# Patient Record
Sex: Female | Born: 1970 | Race: White | Hispanic: No | Marital: Married | Smoking: Never smoker
Health system: Southern US, Community
[De-identification: ages and names within clinical notes are randomized; demographics above are authoritative.]

## PROBLEM LIST (undated history)

## (undated) DIAGNOSIS — F419 Anxiety disorder, unspecified: Secondary | ICD-10-CM

## (undated) DIAGNOSIS — I82409 Acute embolism and thrombosis of unspecified deep veins of unspecified lower extremity: Secondary | ICD-10-CM

## (undated) DIAGNOSIS — J302 Other seasonal allergic rhinitis: Secondary | ICD-10-CM

## (undated) DIAGNOSIS — T7840XA Allergy, unspecified, initial encounter: Secondary | ICD-10-CM

## (undated) DIAGNOSIS — K219 Gastro-esophageal reflux disease without esophagitis: Secondary | ICD-10-CM

## (undated) DIAGNOSIS — K2970 Gastritis, unspecified, without bleeding: Secondary | ICD-10-CM

## (undated) HISTORY — DX: Allergy, unspecified, initial encounter: T78.40XA

## (undated) HISTORY — PX: WISDOM TOOTH EXTRACTION: SHX21

## (undated) HISTORY — DX: Gastritis, unspecified, without bleeding: K29.70

## (undated) HISTORY — DX: Anxiety disorder, unspecified: F41.9

## (undated) HISTORY — DX: Gastro-esophageal reflux disease without esophagitis: K21.9

## (undated) HISTORY — DX: Other seasonal allergic rhinitis: J30.2

## (undated) HISTORY — DX: Acute embolism and thrombosis of unspecified deep veins of unspecified lower extremity: I82.409

---

## 2005-07-25 ENCOUNTER — Emergency Department: Payer: Self-pay | Admitting: Emergency Medicine

## 2005-08-05 ENCOUNTER — Emergency Department: Payer: Self-pay | Admitting: Internal Medicine

## 2005-08-08 ENCOUNTER — Emergency Department: Payer: Self-pay | Admitting: Emergency Medicine

## 2006-05-18 ENCOUNTER — Ambulatory Visit: Payer: Self-pay

## 2010-07-12 HISTORY — PX: KNEE ARTHROSCOPY W/ ACL RECONSTRUCTION: SHX1858

## 2010-08-26 ENCOUNTER — Ambulatory Visit: Payer: Self-pay | Admitting: Orthopedic Surgery

## 2010-09-10 ENCOUNTER — Ambulatory Visit: Payer: Self-pay | Admitting: Orthopedic Surgery

## 2014-10-14 ENCOUNTER — Encounter: Payer: Self-pay | Admitting: Obstetrics & Gynecology

## 2014-10-14 ENCOUNTER — Ambulatory Visit (INDEPENDENT_AMBULATORY_CARE_PROVIDER_SITE_OTHER): Payer: 59 | Admitting: Obstetrics & Gynecology

## 2014-10-14 VITALS — BP 117/79 | HR 88 | Ht 66.0 in | Wt 192.0 lb

## 2014-10-14 DIAGNOSIS — L723 Sebaceous cyst: Secondary | ICD-10-CM

## 2014-10-14 DIAGNOSIS — Z01419 Encounter for gynecological examination (general) (routine) without abnormal findings: Secondary | ICD-10-CM

## 2014-10-14 DIAGNOSIS — N841 Polyp of cervix uteri: Secondary | ICD-10-CM

## 2014-10-14 DIAGNOSIS — Z124 Encounter for screening for malignant neoplasm of cervix: Secondary | ICD-10-CM

## 2014-10-14 DIAGNOSIS — Z803 Family history of malignant neoplasm of breast: Secondary | ICD-10-CM | POA: Diagnosis not present

## 2014-10-14 NOTE — Patient Instructions (Signed)
Thank you for enrolling in [facility]. Please follow the instructions below to securely access your online medical record. MyChart allows you to send messages to your doctor, view your test results, manage appointments, and more.   How Do I Sign Up? 1. In your Internet browser, go to [employer] and enter [email or web address]. 2. Click on the Sign Up Now link in the Sign In box. You will see the New Member Sign Up page. 3. Enter your MyChart Access Code exactly as it appears below. You will not need to use this code after you've completed the sign-up process. If you do not sign up before the expiration date, you must request a new code.  MyChart Access Code: [email or web address] Expires: 12/13/2014  1:34 PM  4. Enter your Social Security Number ([id number]) and Date of Birth (mm/dd/yyyy) as indicated and click Submit. You will be taken to the next sign-up page. 5. Create a MyChart ID. This will be your MyChart login ID and cannot be changed, so think of one that is secure and easy to remember. 6. Create a MyChart password. You can change your password at any time. 7. Enter your Password Reset Question and Answer. This can be used at a later time if you forget your password.  8. Enter your e-mail address. You will receive e-mail notification when new information is available in [facility]. 9. Click Sign Up. You can now view your medical record.   Additional Information Remember, MyChart is NOT to be used for urgent needs. For medical emergencies, dial 911.   Preventive Care for Adults A healthy lifestyle and preventive care can promote health and wellness. Preventive health guidelines for women include the following key practices.  A routine yearly physical is a good way to check with your health care provider about your health and preventive screening. It is a chance to share any concerns and updates on your health and to receive a thorough exam.  Visit your dentist for a routine exam  and preventive care every 6 months. Brush your teeth twice a day and floss once a day. Good oral hygiene prevents tooth decay and gum disease.  The frequency of eye exams is based on your age, health, family medical history, use of contact lenses, and other factors. Follow your health care provider's recommendations for frequency of eye exams.  Eat a healthy diet. Foods like vegetables, fruits, whole grains, low-fat dairy products, and lean protein foods contain the nutrients you need without too many calories. Decrease your intake of foods high in solid fats, added sugars, and salt. Eat the right amount of calories for you.Get information about a proper diet from your health care provider, if necessary.  Regular physical exercise is one of the most important things you can do for your health. Most adults should get at least 150 minutes of moderate-intensity exercise (any activity that increases your heart rate and causes you to sweat) each week. In addition, most adults need muscle-strengthening exercises on 2 or more days a week.  Maintain a healthy weight. The body mass index (BMI) is a screening tool to identify possible weight problems. It provides an estimate of body fat based on height and weight. Your health care provider can find your BMI and can help you achieve or maintain a healthy weight.For adults 20 years and older:  A BMI below 18.5 is considered underweight.  A BMI of 18.5 to 24.9 is normal.  A BMI of 25 to 29.9 is considered overweight.  A BMI  of 30 and above is considered obese.  Maintain normal blood lipids and cholesterol levels by exercising and minimizing your intake of saturated fat. Eat a balanced diet with plenty of fruit and vegetables. Blood tests for lipids and cholesterol should begin at age 52 and be repeated every 5 years. If your lipid or cholesterol levels are high, you are over 50, or you are at high risk for heart disease, you may need your cholesterol levels  checked more frequently.Ongoing high lipid and cholesterol levels should be treated with medicines if diet and exercise are not working.  If you smoke, find out from your health care provider how to quit. If you do not use tobacco, do not start.  Lung cancer screening is recommended for adults aged 68-80 years who are at high risk for developing lung cancer because of a history of smoking. A yearly low-dose CT scan of the lungs is recommended for people who have at least a 30-pack-year history of smoking and are a current smoker or have quit within the past 15 years. A pack year of smoking is smoking an average of 1 pack of cigarettes a day for 1 year (for example: 1 pack a day for 30 years or 2 packs a day for 15 years). Yearly screening should continue until the smoker has stopped smoking for at least 15 years. Yearly screening should be stopped for people who develop a health problem that would prevent them from having lung cancer treatment.  If you are pregnant, do not drink alcohol. If you are breastfeeding, be very cautious about drinking alcohol. If you are not pregnant and choose to drink alcohol, do not have more than 1 drink per day. One drink is considered to be 12 ounces (355 mL) of beer, 5 ounces (148 mL) of wine, or 1.5 ounces (44 mL) of liquor.  Avoid use of street drugs. Do not share needles with anyone. Ask for help if you need support or instructions about stopping the use of drugs.  High blood pressure causes heart disease and increases the risk of stroke. Your blood pressure should be checked at least every 1 to 2 years. Ongoing high blood pressure should be treated with medicines if weight loss and exercise do not work.  If you are 22-31 years old, ask your health care provider if you should take aspirin to prevent strokes.  Diabetes screening involves taking a blood sample to check your fasting blood sugar level. This should be done once every 3 years, after age 68, if you are  within normal weight and without risk factors for diabetes. Testing should be considered at a younger age or be carried out more frequently if you are overweight and have at least 1 risk factor for diabetes.  Breast cancer screening is essential preventive care for women. You should practice "breast self-awareness." This means understanding the normal appearance and feel of your breasts and may include breast self-examination. Any changes detected, no matter how small, should be reported to a health care provider. Women in their 20s and 30s should have a clinical breast exam (CBE) by a health care provider as part of a regular health exam every 1 to 3 years. After age 65, women should have a CBE every year. Starting at age 60, women should consider having a mammogram (breast X-ray test) every year. Women who have a family history of breast cancer should talk to their health care provider about genetic screening. Women at a high risk of breast  cancer should talk to their health care providers about having an MRI and a mammogram every year.  Breast cancer gene (BRCA)-related cancer risk assessment is recommended for women who have family members with BRCA-related cancers. BRCA-related cancers include breast, ovarian, tubal, and peritoneal cancers. Having family members with these cancers may be associated with an increased risk for harmful changes (mutations) in the breast cancer genes BRCA1 and BRCA2. Results of the assessment will determine the need for genetic counseling and BRCA1 and BRCA2 testing.  Routine pelvic exams to screen for cancer are no longer recommended for nonpregnant women who are considered low risk for cancer of the pelvic organs (ovaries, uterus, and vagina) and who do not have symptoms. Ask your health care provider if a screening pelvic exam is right for you.  If you have had past treatment for cervical cancer or a condition that could lead to cancer, you need Pap tests and screening  for cancer for at least 20 years after your treatment. If Pap tests have been discontinued, your risk factors (such as having a new sexual partner) need to be reassessed to determine if screening should be resumed. Some women have medical problems that increase the chance of getting cervical cancer. In these cases, your health care provider may recommend more frequent screening and Pap tests.  The HPV test is an additional test that may be used for cervical cancer screening. The HPV test looks for the virus that can cause the cell changes on the cervix. The cells collected during the Pap test can be tested for HPV. The HPV test could be used to screen women aged 73 years and older, and should be used in women of any age who have unclear Pap test results. After the age of 70, women should have HPV testing at the same frequency as a Pap test.  Colorectal cancer can be detected and often prevented. Most routine colorectal cancer screening begins at the age of 53 years and continues through age 37 years. However, your health care provider may recommend screening at an earlier age if you have risk factors for colon cancer. On a yearly basis, your health care provider may provide home test kits to check for hidden blood in the stool. Use of a small camera at the end of a tube, to directly examine the colon (sigmoidoscopy or colonoscopy), can detect the earliest forms of colorectal cancer. Talk to your health care provider about this at age 46, when routine screening begins. Direct exam of the colon should be repeated every 5-10 years through age 26 years, unless early forms of pre-cancerous polyps or small growths are found.  People who are at an increased risk for hepatitis B should be screened for this virus. You are considered at high risk for hepatitis B if:  You were born in a country where hepatitis B occurs often. Talk with your health care provider about which countries are considered high risk.  Your  parents were born in a high-risk country and you have not received a shot to protect against hepatitis B (hepatitis B vaccine).  You have HIV or AIDS.  You use needles to inject street drugs.  You live with, or have sex with, someone who has hepatitis B.  You get hemodialysis treatment.  You take certain medicines for conditions like cancer, organ transplantation, and autoimmune conditions.  Hepatitis C blood testing is recommended for all people born from [birth year] through 1967 and any individual with known risks for hepatitis C.  Practice safe sex. Use condoms and avoid high-risk sexual practices to reduce the spread of sexually transmitted infections (STIs). STIs include gonorrhea, chlamydia, syphilis, trichomonas, herpes, HPV, and human immunodeficiency virus (HIV). Herpes, HIV, and HPV are viral illnesses that have no cure. They can result in disability, cancer, and death.  You should be screened for sexually transmitted illnesses (STIs) including gonorrhea and chlamydia if:  You are sexually active and are younger than 24 years.  You are older than 24 years and your health care provider tells you that you are at risk for this type of infection.  Your sexual activity has changed since you were last screened and you are at an increased risk for chlamydia or gonorrhea. Ask your health care provider if you are at risk.  If you are at risk of being infected with HIV, it is recommended that you take a prescription medicine daily to prevent HIV infection. This is called preexposure prophylaxis (PrEP). You are considered at risk if:  You are a heterosexual woman, are sexually active, and are at increased risk for HIV infection.  You take drugs by injection.  You are sexually active with a partner who has HIV.  Talk with your health care provider about whether you are at high risk of being infected with HIV. If you choose to begin PrEP, you should first be tested for HIV. You should then be  tested every 3 months for as long as you are taking PrEP.  Osteoporosis is a disease in which the bones lose minerals and strength with aging. This can result in serious bone fractures or breaks. The risk of osteoporosis can be identified using a bone density scan. Women ages 33 years and over and women at risk for fractures or osteoporosis should discuss screening with their health care providers. Ask your health care provider whether you should take a calcium supplement or vitamin D to reduce the rate of osteoporosis.  Menopause can be associated with physical symptoms and risks. Hormone replacement therapy is available to decrease symptoms and risks. You should talk to your health care provider about whether hormone replacement therapy is right for you.  Use sunscreen. Apply sunscreen liberally and repeatedly throughout the day. You should seek shade when your shadow is shorter than you. Protect yourself by wearing long sleeves, pants, a wide-brimmed hat, and sunglasses year round, whenever you are outdoors.  Once a month, do a whole body skin exam, using a mirror to look at the skin on your back. Tell your health care provider of new moles, moles that have irregular borders, moles that are larger than a pencil eraser, or moles that have changed in shape or color.  Stay current with required vaccines (immunizations).  Influenza vaccine. All adults should be immunized every year.  Tetanus, diphtheria, and acellular pertussis (Td, Tdap) vaccine. Pregnant women should receive 1 dose of Tdap vaccine during each pregnancy. The dose should be obtained regardless of the length of time since the last dose. Immunization is preferred during the 27th-36th week of gestation. An adult who has not previously received Tdap or who does not know her vaccine status should receive 1 dose of Tdap. This initial dose should be followed by tetanus and diphtheria toxoids (Td) booster doses every 10 years. Adults with an  unknown or incomplete history of completing a 3-dose immunization series with Td-containing vaccines should begin or complete a primary immunization series including a Tdap dose. Adults should receive a Td booster every 10 years.  Varicella vaccine. An adult without evidence of immunity to varicella should receive 2 doses or a second dose if she has previously received 1 dose. Pregnant females who do not have evidence of immunity should receive the first dose after pregnancy. This first dose should be obtained before leaving the health care facility. The second dose should be obtained 4-8 weeks after the first dose.  Human papillomavirus (HPV) vaccine. Females aged 13-26 years who have not received the vaccine previously should obtain the 3-dose series. The vaccine is not recommended for use in pregnant females. However, pregnancy testing is not needed before receiving a dose. If a female is found to be pregnant after receiving a dose, no treatment is needed. In that case, the remaining doses should be delayed until after the pregnancy. Immunization is recommended for any person with an immunocompromised condition through the age of 64 years if she did not get any or all doses earlier. During the 3-dose series, the second dose should be obtained 4-8 weeks after the first dose. The third dose should be obtained 24 weeks after the first dose and 16 weeks after the second dose.  Zoster vaccine. One dose is recommended for adults aged 60 years or older unless certain conditions are present.  Measles, mumps, and rubella (MMR) vaccine. Adults born before [birth year] generally are considered immune to measles and mumps. Adults born in [birth year] or later should have 1 or more doses of MMR vaccine unless there is a contraindication to the vaccine or there is laboratory evidence of immunity to each of the three diseases. A routine second dose of MMR vaccine should be obtained at least 28 days after the first dose for students  attending postsecondary schools, health care workers, or international travelers. People who received inactivated measles vaccine or an unknown type of measles vaccine during 1963-1967 should receive 2 doses of MMR vaccine. People who received inactivated mumps vaccine or an unknown type of mumps vaccine before 1979 and are at high risk for mumps infection should consider immunization with 2 doses of MMR vaccine. For females of childbearing age, rubella immunity should be determined. If there is no evidence of immunity, females who are not pregnant should be vaccinated. If there is no evidence of immunity, females who are pregnant should delay immunization until after pregnancy. Unvaccinated health care workers born before [birth year] who lack laboratory evidence of measles, mumps, or rubella immunity or laboratory confirmation of disease should consider measles and mumps immunization with 2 doses of MMR vaccine or rubella immunization with 1 dose of MMR vaccine.  Pneumococcal 13-valent conjugate (PCV13) vaccine. When indicated, a person who is uncertain of her immunization history and has no record of immunization should receive the PCV13 vaccine. An adult aged 58 years or older who has certain medical conditions and has not been previously immunized should receive 1 dose of PCV13 vaccine. This PCV13 should be followed with a dose of pneumococcal polysaccharide (PPSV23) vaccine. The PPSV23 vaccine dose should be obtained at least 8 weeks after the dose of PCV13 vaccine. An adult aged 32 years or older who has certain medical conditions and previously received 1 or more doses of PPSV23 vaccine should receive 1 dose of PCV13. The PCV13 vaccine dose should be obtained 1 or more years after the last PPSV23 vaccine dose.  Pneumococcal polysaccharide (PPSV23) vaccine. When PCV13 is also indicated, PCV13 should be obtained first. All adults aged 58 years and older should be immunized. An adult younger than age 44 years who  has certain medical conditions should be immunized. Any person who resides in a nursing home or long-term care facility should be immunized. An adult smoker should be immunized. People with an immunocompromised condition and certain other conditions should receive both PCV13 and PPSV23 vaccines. People with human immunodeficiency virus (HIV) infection should be immunized as soon as possible after diagnosis. Immunization during chemotherapy or radiation therapy should be avoided. Routine use of PPSV23 vaccine is not recommended for American Indians, [place] Natives, or people younger than 65 years unless there are medical conditions that require PPSV23 vaccine. When indicated, people who have unknown immunization and have no record of immunization should receive PPSV23 vaccine. One-time revaccination 5 years after the first dose of PPSV23 is recommended for people aged 19-64 years who have chronic kidney failure, nephrotic syndrome, asplenia, or immunocompromised conditions. People who received 1-2 doses of PPSV23 before age 48 years should receive another dose of PPSV23 vaccine at age 32 years or later if at least 5 years have passed since the previous dose. Doses of PPSV23 are not needed for people immunized with PPSV23 at or after age 50 years.  Meningococcal vaccine. Adults with asplenia or persistent complement component deficiencies should receive 2 doses of quadrivalent meningococcal conjugate (MenACWY-D) vaccine. The doses should be obtained at least 2 months apart. Microbiologists working with certain meningococcal bacteria, [employer] recruits, people at risk during an outbreak, and people who travel to or live in countries with a high rate of meningitis should be immunized. A first-year college student up through age 56 years who is living in a residence hall should receive a dose if she did not receive a dose on or after her 16th birthday. Adults who have certain high-risk conditions should receive one or  more doses of vaccine.  Hepatitis A vaccine. Adults who wish to be protected from this disease, have certain high-risk conditions, work with hepatitis A-infected animals, work in hepatitis A research labs, or travel to or work in countries with a high rate of hepatitis A should be immunized. Adults who were previously unvaccinated and who anticipate close contact with an international adoptee during the first 60 days after arrival in the [place] from a country with a high rate of hepatitis A should be immunized.  Hepatitis B vaccine. Adults who wish to be protected from this disease, have certain high-risk conditions, may be exposed to blood or other infectious body fluids, are household contacts or sex partners of hepatitis B positive people, are clients or workers in certain care facilities, or travel to or work in countries with a high rate of hepatitis B should be immunized.  Haemophilus influenzae type b (Hib) vaccine. A previously unvaccinated person with asplenia or sickle cell disease or having a scheduled splenectomy should receive 1 dose of Hib vaccine. Regardless of previous immunization, a recipient of a hematopoietic stem cell transplant should receive a 3-dose series 6-12 months after her successful transplant. Hib vaccine is not recommended for adults with HIV infection. Preventive Services / Frequency Ages 103 to 62 years  Blood pressure check.** / Every 1 to 2 years.  Lipid and cholesterol check.** / Every 5 years beginning at age 26.  Clinical breast exam.** / Every 3 years for women in their 72s and 60s.  BRCA-related cancer risk assessment.** / For women who have family members with a BRCA-related cancer (breast, ovarian, tubal, or peritoneal cancers).  Pap test.** / Every 2 years from ages 28 through 73. Every 3 years starting at age  49 through age 32 or 96 with a history of 3 consecutive normal Pap tests.  HPV screening.** / Every 3 years from ages 20 through ages 48 to  67 with a history of 3 consecutive normal Pap tests.  Hepatitis C blood test.** / For any individual with known risks for hepatitis C.  Skin self-exam. / Monthly.  Influenza vaccine. / Every year.  Tetanus, diphtheria, and acellular pertussis (Tdap, Td) vaccine.** / Consult your health care provider. Pregnant women should receive 1 dose of Tdap vaccine during each pregnancy. 1 dose of Td every 10 years.  Varicella vaccine.** / Consult your health care provider. Pregnant females who do not have evidence of immunity should receive the first dose after pregnancy.  HPV vaccine. / 3 doses over 6 months, if 23 and younger. The vaccine is not recommended for use in pregnant females. However, pregnancy testing is not needed before receiving a dose.  Measles, mumps, rubella (MMR) vaccine.** / You need at least 1 dose of MMR if you were born in 1957 or later. You may also need a 2nd dose. For females of childbearing age, rubella immunity should be determined. If there is no evidence of immunity, females who are not pregnant should be vaccinated. If there is no evidence of immunity, females who are pregnant should delay immunization until after pregnancy.  Pneumococcal 13-valent conjugate (PCV13) vaccine.** / Consult your health care provider.  Pneumococcal polysaccharide (PPSV23) vaccine.** / 1 to 2 doses if you smoke cigarettes or if you have certain conditions.  Meningococcal vaccine.** / 1 dose if you are age 21 to 24 years and a Market researcher living in a residence hall, or have one of several medical conditions, you need to get vaccinated against meningococcal disease. You may also need additional booster doses.  Hepatitis A vaccine.** / Consult your health care provider.  Hepatitis B vaccine.** / Consult your health care provider.  Haemophilus influenzae type b (Hib) vaccine.** / Consult your health care provider. Ages 16 to 71 years  Blood pressure check.** / Every 1 to 2  years.  Lipid and cholesterol check.** / Every 5 years beginning at age 38 years.  Lung cancer screening. / Every year if you are aged 6-80 years and have a 30-pack-year history of smoking and currently smoke or have quit within the past 15 years. Yearly screening is stopped once you have quit smoking for at least 15 years or develop a health problem that would prevent you from having lung cancer treatment.  Clinical breast exam.** / Every year after age 19 years.  BRCA-related cancer risk assessment.** / For women who have family members with a BRCA-related cancer (breast, ovarian, tubal, or peritoneal cancers).  Mammogram.** / Every year beginning at age 73 years and continuing for as long as you are in good health. Consult with your health care provider.  Pap test.** / Every 3 years starting at age 25 years through age 47 or 57 years with a history of 3 consecutive normal Pap tests.  HPV screening.** / Every 3 years from ages 82 years through ages 41 to 73 years with a history of 3 consecutive normal Pap tests.  Fecal occult blood test (FOBT) of stool. / Every year beginning at age 43 years and continuing until age 36 years. You may not need to do this test if you get a colonoscopy every 10 years.  Flexible sigmoidoscopy or colonoscopy.** / Every 5 years for a flexible sigmoidoscopy or every 10 years for a  colonoscopy beginning at age 21 years and continuing until age 84 years.  Hepatitis C blood test.** / For all people born from [birth year] through 1967 and any individual with known risks for hepatitis C.  Skin self-exam. / Monthly.  Influenza vaccine. / Every year.  Tetanus, diphtheria, and acellular pertussis (Tdap/Td) vaccine.** / Consult your health care provider. Pregnant women should receive 1 dose of Tdap vaccine during each pregnancy. 1 dose of Td every 10 years.  Varicella vaccine.** / Consult your health care provider. Pregnant females who do not have evidence of immunity should  receive the first dose after pregnancy.  Zoster vaccine.** / 1 dose for adults aged 18 years or older.  Measles, mumps, rubella (MMR) vaccine.** / You need at least 1 dose of MMR if you were born in 1957 or later. You may also need a 2nd dose. For females of childbearing age, rubella immunity should be determined. If there is no evidence of immunity, females who are not pregnant should be vaccinated. If there is no evidence of immunity, females who are pregnant should delay immunization until after pregnancy.  Pneumococcal 13-valent conjugate (PCV13) vaccine.** / Consult your health care provider.  Pneumococcal polysaccharide (PPSV23) vaccine.** / 1 to 2 doses if you smoke cigarettes or if you have certain conditions.  Meningococcal vaccine.** / Consult your health care provider.  Hepatitis A vaccine.** / Consult your health care provider.  Hepatitis B vaccine.** / Consult your health care provider.  Haemophilus influenzae type b (Hib) vaccine.** / Consult your health care provider. Ages 26 years and over  Blood pressure check.** / Every 1 to 2 years.  Lipid and cholesterol check.** / Every 5 years beginning at age 28 years.  Lung cancer screening. / Every year if you are aged 29-80 years and have a 30-pack-year history of smoking and currently smoke or have quit within the past 15 years. Yearly screening is stopped once you have quit smoking for at least 15 years or develop a health problem that would prevent you from having lung cancer treatment.  Clinical breast exam.** / Every year after age 67 years.  BRCA-related cancer risk assessment.** / For women who have family members with a BRCA-related cancer (breast, ovarian, tubal, or peritoneal cancers).  Mammogram.** / Every year beginning at age 41 years and continuing for as long as you are in good health. Consult with your health care provider.  Pap test.** / Every 3 years starting at age 40 years through age 35 or 29 years with 3  consecutive normal Pap tests. Testing can be stopped between 65 and 70 years with 3 consecutive normal Pap tests and no abnormal Pap or HPV tests in the past 10 years.  HPV screening.** / Every 3 years from ages 55 years through ages 110 or 28 years with a history of 3 consecutive normal Pap tests. Testing can be stopped between 65 and 70 years with 3 consecutive normal Pap tests and no abnormal Pap or HPV tests in the past 10 years.  Fecal occult blood test (FOBT) of stool. / Every year beginning at age 70 years and continuing until age 70 years. You may not need to do this test if you get a colonoscopy every 10 years.  Flexible sigmoidoscopy or colonoscopy.** / Every 5 years for a flexible sigmoidoscopy or every 10 years for a colonoscopy beginning at age 35 years and continuing until age 44 years.  Hepatitis C blood test.** / For all people born from [birth year] through 1967  and any individual with known risks for hepatitis C.  Osteoporosis screening.** / A one-time screening for women ages 68 years and over and women at risk for fractures or osteoporosis.  Skin self-exam. / Monthly.  Influenza vaccine. / Every year.  Tetanus, diphtheria, and acellular pertussis (Tdap/Td) vaccine.** / 1 dose of Td every 10 years.  Varicella vaccine.** / Consult your health care provider.  Zoster vaccine.** / 1 dose for adults aged 60 years or older.  Pneumococcal 13-valent conjugate (PCV13) vaccine.** / Consult your health care provider.  Pneumococcal polysaccharide (PPSV23) vaccine.** / 1 dose for all adults aged 7 years and older.  Meningococcal vaccine.** / Consult your health care provider.  Hepatitis A vaccine.** / Consult your health care provider.  Hepatitis B vaccine.** / Consult your health care provider.  Haemophilus influenzae type b (Hib) vaccine.** / Consult your health care provider. ** Family history and personal history of risk and conditions may change your health care provider's  recommendations. Document Released: 08/24/2001 Document Revised: 11/12/2013 Document Reviewed: 11/23/2010 [facility] Patient Information 2015 [place], [place]. This information is not intended to replace advice given to you by your health care provider. Make sure you discuss any questions you have with your health care provider.

## 2014-10-14 NOTE — Progress Notes (Signed)
GYNECOLOGY CLINIC ANNUAL PREVENTATIVE CARE ENCOUNTER NOTE  Subjective:     Monica Floyd is a 44 y.o. G65P2002 female here for a routine annual gynecologic exam.  Current complaints: recurrent periclitoral cyst that resolves with sitz baths and Neosporin application.  When inflammed, it hurts, gets to be as big as a pea.  Reports that she has resolving cyst currently.  This is 3rd recurrence, first time was in 08/2014 and twice in 09/2014.   Has recently been working out a lot, sweats a lot but always showers after each workout. No other GYN concerns   Gynecologic History Patient's last menstrual period was 09/30/2014. Contraception: vasectomy Last Pap: 2012. Results were: normal. No cervical dysplasia. Last mammogram: 2012. Results were: normal.  Mother diagnosed with breast cancer at age 47.  Obstetric History OB History  Gravida Para Term Preterm AB SAB TAB Ectopic Multiple Living  2 2 2       2     # Outcome Date GA Lbr Len/2nd Weight Sex Delivery Anes PTL Lv  2 Term           1 Term               History   Social History  . Marital Status: Married    Spouse Name: N/A  . Number of Children: N/A  . Years of Education: N/A   Occupational History  . Not on file.   Social History Main Topics  . Smoking status: Never Smoker   . Smokeless tobacco: Never Used  . Alcohol Use: 0.0 oz/week    0 Standard drinks or equivalent per week  . Drug Use: No  . Sexual Activity:    Partners: Male    Birth Control/ Protection: Surgical     Comment: husband-vasectomy   Other Topics Concern  . Not on file   Social History Narrative  . No narrative on file    The following portions of the patient's history were reviewed and updated as appropriate: allergies, current medications, past family history, past medical history, past social history, past surgical history and problem list.  Review of Systems Pertinent items are noted in HPI.   Objective:   BP 117/79 mmHg  Pulse 88   Ht 5\' 6"  (1.676 m)  Wt 192 lb (87.091 kg)  BMI 31.00 kg/m2  LMP 09/30/2014 GENERAL: Well-developed, well-nourished female in no acute distress.  HEENT: Normocephalic, atraumatic. Sclerae anicteric.  NECK: Supple. Normal thyroid.  LUNGS: Clear to auscultation bilaterally.  HEART: Regular rate and rhythm. BREASTS: Symmetric in size. No masses, skin changes, nipple drainage, or lymphadenopathy. ABDOMEN: Soft, nontender, nondistended. No organomegaly. PELVIC: Normal external female genitalia.  On close examination of the vulva, small sebaceous cysts noted on the external part of labia minora bilaterally.  There was one lesion on left which was about 4 mm in size, patient pointed to this area as the resolving cyst. No erythema, no drainage.  Vagina is pink and rugated.  Normal discharge. Normal cervix contour but 7 mm polypoid lesion noted in endocervical canal which had some bleeding when pap smear was obtained. Uterus is normal in size. No adnexal mass or tenderness.  EXTREMITIES: No cyanosis, clubbing, or edema, 2+ distal pulses.   Assessment:   Annual gynecologic examination Recurrent periclitoral cyst/sebaceous cysts  Cervical polyp Family history of breast cancer   Plan:   Pap done, will follow up results and manage accordingly. Patient reassured about sebaceous cysts that result from gland blockage, recommended looser  workout clothes and continue proper hygiene after workouts. No other intervention needed for now Patient declined removal of cervical polyp at this point; does not have any irregular bleeding or other concerns. Will continue to monitor. Mammogram ordered; patient will schedule this on her own at the [facility]. Encouraged yearly mammograms especially given FH of breast cancer.   Routine preventative health maintenance measures emphasized  Total face-to-face time with patient in discussing sebaceous cysts and cervical polyp: 10 minutes. Over 50% of encounter was  spent on counseling and coordination of care about these issues.   Verita Schneiders, MD, [place] Attending [place] for [employer], [place]

## 2014-10-23 LAB — PAP IG AND HPV HIGH-RISK
HPV, high-risk: NEGATIVE
PAP SMEAR COMMENT: 0

## 2015-06-20 ENCOUNTER — Encounter: Payer: Self-pay | Admitting: Family Medicine

## 2015-06-20 ENCOUNTER — Ambulatory Visit (INDEPENDENT_AMBULATORY_CARE_PROVIDER_SITE_OTHER): Payer: 59 | Admitting: Family Medicine

## 2015-06-20 VITALS — BP 101/69 | HR 85 | Ht 66.5 in | Wt 166.0 lb

## 2015-06-20 DIAGNOSIS — R1031 Right lower quadrant pain: Secondary | ICD-10-CM

## 2015-06-20 DIAGNOSIS — R102 Pelvic and perineal pain: Secondary | ICD-10-CM | POA: Diagnosis not present

## 2015-06-20 MED ORDER — DICLOFENAC SODIUM 75 MG PO TBEC: 75.0000 mg | DELAYED_RELEASE_TABLET | Freq: Two times a day (BID) | ORAL | Status: DC

## 2015-06-20 NOTE — Progress Notes (Signed)
    Subjective:    Patient ID: Monica Floyd is a 44 y.o. female presenting with Pelvic Pain  on 06/20/2015  HPI: 3-4 month h/o RLQ. Nagging at first but is getting better. Worse with jumping or working out.  Sneezing is also exquisitely tender.  Hurt all day yesterday. Appeared swollen. Pushing on it made it better, but now that makes it worse.  Not related to cycle. LMP on Saturday and then got worse. She states it is better today.  Review of Systems  Constitutional: Negative for fever and chills.  Respiratory: Negative for shortness of breath.   Cardiovascular: Negative for chest pain.  Gastrointestinal: Positive for diarrhea. Negative for nausea, vomiting, abdominal pain and constipation.  Genitourinary: Negative for dysuria.  Skin: Negative for rash.      Objective:    BP 101/69 mmHg  Pulse 85  Ht 5' 6.5" (1.689 m)  Wt 166 lb (75.297 kg)  BMI 26.39 kg/m2  LMP 06/14/2015 Physical Exam  Constitutional: She is oriented to person, place, and time. She appears well-developed and well-nourished. No distress.  HENT:  Head: Normocephalic and atraumatic.  Eyes: No scleral icterus.  Neck: Neck supple.  Cardiovascular: Normal rate.   Pulmonary/Chest: Effort normal.  Abdominal: Soft.  Genitourinary: Uterus normal. Right adnexum displays tenderness. Right adnexum displays no mass. Left adnexum displays no mass and no tenderness.  Neurological: She is alert and oriented to person, place, and time.  Skin: Skin is warm and dry.  Psychiatric: She has a normal mood and affect.   TVUS - Uterus is 10 x 4.8 cm. Cervix is 4.4 cm long. Endometrial stripe is 75 mm. Left ovary is 4.12 x 2.45. Right ovary is 4.12 x 2.28 cm with single follicle measuring [id number] x 0.99. There is no free fluid or exquisite tenderness during the exam.     Assessment & Plan:   Problem List Items Addressed This Visit    None    Visit Diagnoses    Pelvic pain in female    -  Primary    suspect ruptured  cyst--although u/s is negative, but pain is improving.  Pain meds given. f/u as needed.      Trial of Diclofenac   Return if symptoms worsen or fail to improve.  Rielyn Krupinski S 06/20/2015 8:21 AM

## 2015-06-20 NOTE — Progress Notes (Signed)
RLQ pain, that has been going on for 3-4 months.  It has been worse this week.

## 2015-06-20 NOTE — Patient Instructions (Signed)

## 2016-03-24 ENCOUNTER — Emergency Department
Admission: EM | Admit: 2016-03-24 | Discharge: 2016-03-24 | Disposition: A | Discharge status: Home / Self Care | Payer: 59 | Attending: Student in an Organized Health Care Education/Training Program | Admitting: Student in an Organized Health Care Education/Training Program

## 2016-03-24 ENCOUNTER — Emergency Department: Payer: 59

## 2016-03-24 DIAGNOSIS — R202 Paresthesia of skin: Secondary | ICD-10-CM | POA: Diagnosis present

## 2016-03-24 DIAGNOSIS — R200 Anesthesia of skin: Secondary | ICD-10-CM | POA: Diagnosis not present

## 2016-03-24 DIAGNOSIS — Z791 Long term (current) use of non-steroidal anti-inflammatories (NSAID): Secondary | ICD-10-CM | POA: Insufficient documentation

## 2016-03-24 DIAGNOSIS — I639 Cerebral infarction, unspecified: Secondary | ICD-10-CM

## 2016-03-24 LAB — URINALYSIS COMPLETE WITH MICROSCOPIC (ARMC ONLY)
BILIRUBIN URINE: NEGATIVE
Bacteria, UA: NONE SEEN
Glucose, UA: NEGATIVE mg/dL
Hgb urine dipstick: NEGATIVE
KETONES UR: NEGATIVE mg/dL
LEUKOCYTES UA: NEGATIVE
Nitrite: NEGATIVE
PH: 6 (ref 5.0–8.0)
PROTEIN: NEGATIVE mg/dL
SPECIFIC GRAVITY, URINE: 1.008 (ref 1.005–1.030)

## 2016-03-24 LAB — CBC
HCT: 41 % (ref 35.0–47.0)
Hemoglobin: 14.1 g/dL (ref 12.0–16.0)
MCH: 31.3 pg (ref 26.0–34.0)
MCHC: 34.3 g/dL (ref 32.0–36.0)
MCV: 91.3 fL (ref 80.0–100.0)
PLATELETS: 249 10*3/uL (ref 150–440)
RBC: 4.49 MIL/uL (ref 3.80–5.20)
RDW: 13.5 % (ref 11.5–14.5)
WBC: 8.1 10*3/uL (ref 3.6–11.0)

## 2016-03-24 LAB — BASIC METABOLIC PANEL
Anion gap: 8 (ref 5–15)
BUN: 17 mg/dL (ref 6–20)
CALCIUM: 9.1 mg/dL (ref 8.9–10.3)
CHLORIDE: 105 mmol/L (ref 101–111)
CO2: 25 mmol/L (ref 22–32)
CREATININE: 0.95 mg/dL (ref 0.44–1.00)
GFR calc Af Amer: >60 mL/min (ref >60)
GFR calc non Af Amer: >60 mL/min (ref >60)
Glucose, Bld: 104 mg/dL — ABNORMAL HIGH (ref 65–99)
Potassium: 4 mmol/L (ref 3.5–5.1)
SODIUM: 138 mmol/L (ref 135–145)

## 2016-03-24 MED ORDER — HYDROCORTISONE-ACETIC ACID 1-2 % OT SOLN: 3.0000 [drp] | Freq: Two times a day (BID) | OTIC | 0 refills | Status: DC

## 2016-03-24 MED ORDER — GADOBENATE DIMEGLUMINE 529 MG/ML IV SOLN
15.0000 mL | Freq: Once | INTRAVENOUS | Status: AC | PRN
  Administered 2016-03-24: 15 mL via INTRAVENOUS

## 2016-03-24 MED ORDER — AMOXICILLIN 500 MG PO TABS: 500.0000 mg | ORAL_TABLET | Freq: Two times a day (BID) | ORAL | 0 refills | Status: AC

## 2016-03-24 NOTE — ED Triage Notes (Signed)
Pt c/o right lower jaw being numb for the past 3 days with a headache to the back of the head.. No other numbness areas noted at this time.

## 2016-03-24 NOTE — ED Notes (Signed)
Pt in via triage with complaints of right jaw numbness/tingling since Monday morning.  Pt reports waking up with the numbness thinking she had slept wrong but the numbness never resolved.  [place] been out of the state until late last night which is why she is waiting until now to be evaluated.  Pt does report intermittent headache on the left side and reports pressure to back of head at this time.  Pt denies any changes in vision or speech, denies any weakness.  Pt A/Ox4, vitals WDL, no immediate distress noted at this time.

## 2016-03-24 NOTE — Discharge Instructions (Signed)
Take antibiotics as directed.  Follow-up with primary care physician as well as an ENT. Return for any worsening numbness tingling, fevers or right sided pain.

## 2016-03-24 NOTE — ED Notes (Signed)
Called MRI in efforts to see what the delay is, no answer.  Walked to MRI to find dept locked up.  MD notified.  Secretary notified to call MRI back in.

## 2016-03-24 NOTE — ED Notes (Signed)
Patient transported to MRI 

## 2016-03-24 NOTE — ED Provider Notes (Signed)
[facility] Emergency Department Provider Note    First MD Initiated Contact with Patient 03/24/16 1530     (approximate)  I have reviewed the triage vital signs and the nursing notes.   HISTORY  Chief Complaint Numbness (facial)    HPI Monica Floyd is a 45 y.o. female who presents with 3 days of progressively worsening right jaw and superior neck numbness and tingling. Patient states that she noted the symptoms 3 days ago. 4 days ago she had a migraine headache on the left side. Was not a sudden onset headache. Denies any headache at this time. No recent fevers. No tick bites. Denies any previous history of high blood pressure, "cardiac disease, stroke. She is a nonsmoker. Denies any trauma. No visual disturbances. States that she does feel some fullness on the right ear but does not have any pain or hearing loss. She never had these symptoms before.   History reviewed. No pertinent past medical history.  There are no active problems to display for this patient.   Past Surgical History:  Procedure Laterality Date  . KNEE ARTHROSCOPY W/ ACL RECONSTRUCTION Right 2012    Prior to Admission medications   Medication Sig Start Date End Date Taking? Authorizing Provider  diclofenac (VOLTAREN) 75 MG EC tablet Take 1 tablet (75 mg total) by mouth 2 (two) times daily with a meal. 06/20/15   Donnamae Jude, MD    Allergies Review of patient's allergies indicates no known allergies.  Family History  Problem Relation Age of Onset  . Cancer Mother 14    breast  . Cancer Father     prostate/bladder    Social History Social History  Substance Use Topics  . Smoking status: Never Smoker  . Smokeless tobacco: Never Used  . Alcohol use 0.0 oz/week    Review of Systems Patient denies headaches, rhinorrhea, blurry vision, numbness, shortness of breath, chest pain, edema, cough, abdominal pain, nausea, vomiting, diarrhea, dysuria, fevers, rashes or  hallucinations unless otherwise stated above in HPI. ____________________________________________   PHYSICAL EXAM:  VITAL SIGNS: Vitals:   03/24/16 1321  BP: 125/76  Pulse: 73  Resp: 17  Temp: 98.4 F (36.9 C)    Constitutional: Alert and oriented. Well appearing and in no acute distress. Eyes: Conjunctivae are normal. PERRL. EOMI. Head: Atraumatic. Nose: No congestion/rhinnorhea. Mouth/Throat: Mucous membranes are moist.  Oropharynx non-erythematous. Neck: No stridor. Painless ROM. No cervical spine tenderness to palpation Hematological/Lymphatic/Immunilogical: No cervical lymphadenopathy. Cardiovascular: Normal rate, regular rhythm. Grossly normal heart sounds.  Good peripheral circulation. Respiratory: Normal respiratory effort.  No retractions. Lungs CTAB. Gastrointestinal: Soft and nontender. No distention. No abdominal bruits. No CVA tenderness. Genitourinary:  Musculoskeletal: No lower extremity tenderness nor edema.  No joint effusions. Neurologic:  CN intact with exception of light touch deficit in V3 distribution on right side.  No facial droop, Normal FNF.  Normal heel to shin.  Sensation intact bilaterally. Normal speech and language. No gross focal neurologic deficits are appreciated. No gait instability.  Skin:  Skin is warm, dry and intact. No rash noted. Psychiatric: Mood and affect are normal. Speech and behavior are normal.  ____________________________________________   LABS (all labs ordered are listed, but only abnormal results are displayed)  Results for orders placed or performed during the hospital encounter of 03/24/16 (from the past 24 hour(s))  Basic metabolic panel     Status: Abnormal   Collection Time: 03/24/16  1:25 PM  Result Value Ref Range  Sodium 138 135 - 145 mmol/L   Potassium 4.0 3.5 - 5.1 mmol/L   Chloride 105 101 - 111 mmol/L   CO2 25 22 - 32 mmol/L   Glucose, Bld 104 (H) 65 - 99 mg/dL   BUN 17 6 - 20 mg/dL   Creatinine, Ser  0.95 0.44 - 1.00 mg/dL   Calcium 9.1 8.9 - 10.3 mg/dL   GFR calc non Af Amer >60 >60 mL/min   GFR calc Af Amer >60 >60 mL/min   Anion gap 8 5 - 15  CBC     Status: None   Collection Time: 03/24/16  1:25 PM  Result Value Ref Range   WBC 8.1 3.6 - 11.0 K/uL   RBC 4.49 3.80 - 5.20 MIL/uL   Hemoglobin 14.1 12.0 - 16.0 g/dL   HCT 41.0 35.0 - 47.0 %   MCV 91.3 80.0 - 100.0 fL   MCH 31.3 26.0 - 34.0 pg   MCHC 34.3 32.0 - 36.0 g/dL   RDW 13.5 11.5 - 14.5 %   Platelets 249 150 - 440 K/uL  Urinalysis complete, with microscopic     Status: Abnormal   Collection Time: 03/24/16  1:25 PM  Result Value Ref Range   Color, Urine STRAW (A) YELLOW   APPearance CLEAR (A) CLEAR   Glucose, UA NEGATIVE NEGATIVE mg/dL   Bilirubin Urine NEGATIVE NEGATIVE   Ketones, ur NEGATIVE NEGATIVE mg/dL   Specific Gravity, Urine 1.008 1.005 - 1.030   Hgb urine dipstick NEGATIVE NEGATIVE   pH 6.0 5.0 - 8.0   Protein, ur NEGATIVE NEGATIVE mg/dL   Nitrite NEGATIVE NEGATIVE   Leukocytes, UA NEGATIVE NEGATIVE   RBC / HPF 0-5 0 - 5 RBC/hpf   WBC, UA 0-5 0 - 5 WBC/hpf   Bacteria, UA NONE SEEN NONE SEEN   Squamous Epithelial / LPF 0-5 (A) NONE SEEN   ____________________________________________  EKG My review and personal interpretation at Time: 13:28   Indication: cva  Rate: 60  Rhythm: nsr Axis: normal Other: normal ekg ____________________________________________  RADIOLOGY   ____________________________________________   PROCEDURES  Procedure(s) performed: none    Critical Care performed: no ____________________________________________   INITIAL IMPRESSION / ASSESSMENT AND PLAN / ED COURSE  Pertinent labs & imaging results that were available during my care of the patient were reviewed by me and considered in my medical decision making (see chart for details).  DDX: cva, bells, ms, brainstem compression, complicated migraine, trigeminal neuroalgia  Monica Floyd is a 45 y.o. who presents  to the ED with 3 days of right facial numbness status post headache on the left side. Neuro exam with decreased sensation on the right jaw and right superior neck. No evidence of Bell's palsy. No evidence of acute otitis. She has no meningismus. No other focal neuro deficits to explain or suggest a large territory CVA. Silva Bandy is less consistent with this subarachnoid or aneurysm patient also has no visual deficits and does not have a headache at this time. Other diagnosis considered to include demyelinating disease and she does have a focal cranial nerve deficit will order MRI of the brain to evaluate.  The patient will be placed on continuous pulse oximetry and telemetry for monitoring.  Laboratory evaluation will be sent to evaluate for the above complaints.     Clinical Course  Comment By Time  Patient was reassessed.New focal deficits. Still has tingling on the right V3 distribution. MRI shows no evidence of acute stroke or mass. No evidence of  demyelinating disease. Does have evidence of right mastoid effusion suggesting that there could be some impingement on the right V3. Given her complaint of some congestion on the right side will start treatment for acute otitis. This is not clinically consistent with subarachnoid. She has no signs of meningismus. Patient able to ambulate with a steady gait and able to tolerate oral medications. The patient is stable for further outpatient workup. Merlyn Lot, MD 09/13 1920     ____________________________________________   FINAL CLINICAL IMPRESSION(S) / ED DIAGNOSES  Final diagnoses:  Right facial numbness      NEW MEDICATIONS STARTED DURING THIS VISIT:  New Prescriptions   No medications on file     Note:  This document was prepared using Dragon voice recognition software and may include unintentional dictation errors.    Merlyn Lot, MD 03/24/16 1921

## 2017-01-03 ENCOUNTER — Other Ambulatory Visit: Payer: Self-pay | Admitting: *Deleted

## 2017-01-03 ENCOUNTER — Other Ambulatory Visit: Payer: Self-pay | Admitting: Obstetrics & Gynecology

## 2017-01-03 ENCOUNTER — Other Ambulatory Visit: Payer: Self-pay

## 2017-01-03 DIAGNOSIS — N63 Unspecified lump in unspecified breast: Secondary | ICD-10-CM

## 2017-01-03 DIAGNOSIS — Z1231 Encounter for screening mammogram for malignant neoplasm of breast: Secondary | ICD-10-CM

## 2017-01-03 NOTE — Addendum Note (Signed)
Addended by: Gretchen Short on: 01/03/2017 10:15 AM   Modules accepted: Orders

## 2017-01-03 NOTE — Progress Notes (Signed)
Pt called in wanting order for MM so results will be back when she comes in for annual 7/17. Orders placed

## 2017-01-06 ENCOUNTER — Other Ambulatory Visit: Payer: 59

## 2017-01-07 ENCOUNTER — Ambulatory Visit
Admission: RE | Admit: 2017-01-07 | Discharge: 2017-01-07 | Disposition: A | Discharge status: Home / Self Care | Payer: 59 | Source: Ambulatory Visit | Attending: Obstetrics & Gynecology | Admitting: Obstetrics & Gynecology

## 2017-01-07 DIAGNOSIS — N63 Unspecified lump in unspecified breast: Secondary | ICD-10-CM

## 2017-01-25 ENCOUNTER — Ambulatory Visit: Payer: 59 | Admitting: Obstetrics & Gynecology

## 2017-02-10 ENCOUNTER — Ambulatory Visit: Payer: 59 | Admitting: Obstetrics & Gynecology

## 2017-03-20 NOTE — Progress Notes (Signed)
Last pap 10/14/14 - Normal Last MM 01/07/17 - Needs in 79yrFasting labs done at work [facility] [facility] [place]. Never been a problem.Last 8 months Increased cramping, heavy flow, clotting, dark blood, irregular, etc. She has had weight gain of 16lbs.

## 2017-03-24 ENCOUNTER — Ambulatory Visit (INDEPENDENT_AMBULATORY_CARE_PROVIDER_SITE_OTHER): Payer: 59 | Admitting: Obstetrics & Gynecology

## 2017-03-24 ENCOUNTER — Encounter: Payer: Self-pay | Admitting: Obstetrics & Gynecology

## 2017-03-24 VITALS — BP 117/77 | HR 69 | Ht 66.0 in | Wt 187.0 lb

## 2017-03-24 DIAGNOSIS — Z01419 Encounter for gynecological examination (general) (routine) without abnormal findings: Secondary | ICD-10-CM

## 2017-03-24 DIAGNOSIS — Z23 Encounter for immunization: Secondary | ICD-10-CM | POA: Diagnosis not present

## 2017-03-24 DIAGNOSIS — N920 Excessive and frequent menstruation with regular cycle: Secondary | ICD-10-CM | POA: Diagnosis not present

## 2017-03-24 DIAGNOSIS — Z803 Family history of malignant neoplasm of breast: Secondary | ICD-10-CM

## 2017-03-24 MED ORDER — TRANEXAMIC ACID 650 MG PO TABS: 1300.0000 mg | ORAL_TABLET | Freq: Three times a day (TID) | ORAL | 5 refills | Status: DC

## 2017-03-24 NOTE — Progress Notes (Signed)
GYNECOLOGY ANNUAL PREVENTATIVE CARE ENCOUNTER NOTE  Subjective:   Monica Floyd is a 46 y.o. G74P2002 female here for a routine annual gynecologic exam.  Current complaints: menorrhagia x 8 months.  The cycle length periodically changes but periods are mostly regular. Associated with heavy bleeding with clots and abdominal cramping. She wants complete evaluation for this as she is concerned.    Denies abnormal vaginal discharge, problems with intercourse or other gynecologic concerns.  Of note, patient desires BRCA testing, wants lab order to do this at work. Also desires Tdap vaccine.   Gynecologic History Patient's last menstrual period was 03/17/2017. Contraception: vasectomy Last Pap: 10/14/2014. Results were: normal Last mammogram: 01/07/2017. Results were: normal  Obstetric History OB History  Gravida Para Term Preterm AB Living  _0 SAB TAB Ectopic Multiple Live Births          2    # Outcome Date GA Lbr Len/2nd Weight Sex Delivery Anes PTL Lv  2 Term      Vag-Spont   LIV  1 Term      Vag-Spont   LIV      Past Medical History:  Diagnosis Date  . Seasonal allergies     Past Surgical History:  Procedure Laterality Date  . KNEE ARTHROSCOPY W/ ACL RECONSTRUCTION Right 2012    Current Outpatient Prescriptions on File Prior to Visit  Medication Sig Dispense Refill  . acetic acid-hydrocortisone (VOSOL-HC) otic solution Place 3 drops into the right ear 2 (two) times daily. 10 mL 0   No current facility-administered medications on file prior to visit.     No Known Allergies  Social History   Social History  . Marital status: Married    Spouse name: N/A  . Number of children: N/A  . Years of education: N/A   Occupational History  . Not on file.   Social History Main Topics  . Smoking status: Never Smoker  . Smokeless tobacco: Never Used  . Alcohol use 0.0 oz/week     Comment: occasionally  . Drug use: No  . Sexual activity: Yes    Partners: Male      Birth control/ protection: Surgical     Comment: husband-vasectomy   Other Topics Concern  . Not on file   Social History Narrative  . No narrative on file    Family History  Problem Relation Age of Onset  . Cancer Mother 45       breast  . Breast cancer Mother   . Cancer Father        prostate/bladder  . Breast cancer Maternal Grandmother 65  . Breast cancer Maternal Aunt 50    The following portions of the patient's history were reviewed and updated as appropriate: allergies, current medications, past family history, past medical history, past social history, past surgical history and problem list.  Review of Systems Pertinent items noted in HPI and remainder of comprehensive ROS otherwise negative.   Objective:  BP 117/77   Pulse 69   Ht _1  (1.676 m)   Wt 187 lb (84.8 kg)   LMP 03/17/2017   BMI 30.18 kg/m  CONSTITUTIONAL: Well-developed, well-nourished female in no acute distress.  HENT:  Normocephalic, atraumatic, External right and left ear normal. Oropharynx is clear and moist EYES: Conjunctivae and EOM are normal. Pupils are equal, round, and reactive to light. No scleral icterus.  NECK: Normal range of motion, supple, no masses.  Normal  thyroid.  SKIN: Skin is warm and dry. No rash noted. Not diaphoretic. No erythema. No pallor. NEUROLOGIC: Alert and oriented to person, place, and time. Normal reflexes, muscle tone coordination. No cranial nerve deficit noted. PSYCHIATRIC: Normal mood and affect. Normal behavior. Normal judgment and thought content. CARDIOVASCULAR: Normal heart rate noted, regular rhythm RESPIRATORY: Clear to auscultation bilaterally. Effort and breath sounds normal, no problems with respiration noted. BREASTS: Symmetric in size. No masses, skin changes, nipple drainage, or lymphadenopathy. ABDOMEN: Soft, normal bowel sounds, no distention noted.  Mild lower abdominal tenderness, rebound or guarding.  PELVIC: Normal appearing external  genitalia; normal appearing vaginal mucosa and cervix.  Small amount of bloody discharge noted.  Pap smear obtained.  Normal uterine size, no other palpable masses, mild uterine but no adnexal tenderness. MUSCULOSKELETAL: Normal range of motion. No tenderness.  No cyanosis, clubbing, or edema.  2+ distal pulses.  ENDOMETRIAL BIOPSY     The indications for endometrial biopsy were reviewed.   Risks of the biopsy including cramping, bleeding, infection, uterine perforation, inadequate specimen and need for additional procedures  were discussed. The patient states she understands and agrees to undergo procedure today. Consent was signed. Time out was performed.  During the pelvic exam, the cervix was prepped with Betadine. A single-toothed tenaculum was placed on the anterior lip of the cervix to stabilize it. The 3 mm pipelle was introduced into the endometrial cavity without difficulty to a depth of 8cm, and a moderate amount of tissue was obtained and sent to pathology. The instruments were removed from the patient's vagina. Minimal bleeding from the cervix was noted. The patient tolerated the procedure well. Routine post-procedure instructions were given to the patient.     Assessment and Plan:  1. Menorrhagia with regular cycle Likely due to perimenopausal state but needs evaluation to rule out other etiologies. She has a normal exam, no evidence of lesions.  Will order abnormal uterine bleeding evaluation labs and pelvic ultrasound to evaluate for any structural gynecologic abnormalities.  Discussed management options for abnormal uterine bleeding including tranexamic acid (Lysteda), oral progesterone (Megace), Depo Provera, Mirena IUD, endometrial ablation (Novasure/Hydrothermal Ablation) or hysterectomy as definitive surgical management.  Discussed risks and benefits of each method.   Patient desires Marshell Levan for now, this was prescribed as needed for now,  bleeding precautions reviewed.  - TSH -  CBC - IGP, Aptima HPV, rfx 16/18,45 - NuSwab VG+, Candida 6sp - US PELVIS (TRANSABDOMINAL ONLY); Future - US PELVIS TRANSVANGINAL NON-OB (TV ONLY); Future - Pathology (endometrial biopsy) - tranexamic acid (LYSTEDA) 650 MG TABS tablet; Take 2 tablets (1,300 mg total) by mouth 3 (three) times daily. Take during menses for a maximum of five days  Dispense: 30 tablet; Refill: 5  2. Family history of breast cancer - BRCAssure Comprehensive Test  3. Immunization due - Tdap vaccine greater than or equal to 7yo IM  4. Encounter for gynecological examination with Papanicolaou smear of cervix Will follow up results of pap smear and manage accordingly. Mammogram is up-to-date Routine preventative health maintenance measures emphasized. Please refer to After Visit Summary for other counseling recommendations.    Verita Schneiders, MD, [place] Attending Obstetrician & Gynecologist, [place] [facility]

## 2017-03-24 NOTE — Patient Instructions (Signed)

## 2017-03-29 ENCOUNTER — Ambulatory Visit
Admission: RE | Admit: 2017-03-29 | Discharge: 2017-03-29 | Disposition: A | Discharge status: Home / Self Care | Payer: 59 | Source: Ambulatory Visit | Attending: Obstetrics & Gynecology | Admitting: Obstetrics & Gynecology

## 2017-03-29 DIAGNOSIS — D259 Leiomyoma of uterus, unspecified: Secondary | ICD-10-CM | POA: Diagnosis not present

## 2017-03-29 DIAGNOSIS — N920 Excessive and frequent menstruation with regular cycle: Secondary | ICD-10-CM | POA: Diagnosis present

## 2017-03-29 LAB — NUSWAB VG+, CANDIDA 6SP
CANDIDA PARAPSILOSIS, NAA: NEGATIVE
CANDIDA TROPICALIS, NAA: NEGATIVE
Candida albicans, NAA: NEGATIVE
Candida glabrata, NAA: NEGATIVE
Candida krusei, NAA: NEGATIVE
Candida lusitaniae, NAA: NEGATIVE
Chlamydia trachomatis, NAA: NEGATIVE
Neisseria gonorrhoeae, NAA: NEGATIVE
Trich vag by NAA: NEGATIVE

## 2017-03-29 LAB — PATHOLOGY

## 2017-03-29 LAB — IGP, APTIMA HPV, RFX 16/18,45
HPV Aptima: NEGATIVE
PAP Smear Comment: 0

## 2017-04-04 LAB — CBC
Hematocrit: 38.5 % (ref 34.0–46.6)
Hemoglobin: 13 g/dL (ref 11.1–15.9)
MCH: 30.9 pg (ref 26.6–33.0)
MCHC: 33.8 g/dL (ref 31.5–35.7)
MCV: 91 fL (ref 79–97)
PLATELETS: 274 10*3/uL (ref 150–379)
RBC: 4.21 x10E6/uL (ref 3.77–5.28)
RDW: 13.2 % (ref 12.3–15.4)
WBC: 5.8 10*3/uL (ref 3.4–10.8)

## 2017-04-04 LAB — BRCASSURE COMPREHENSIVE TEST

## 2017-04-04 LAB — COMPREHENSIVE BRCA1/2 ANALYSIS

## 2017-04-04 LAB — TSH: TSH: 0.544 u[IU]/mL (ref 0.450–4.500)

## 2017-06-30 ENCOUNTER — Encounter: Payer: Self-pay | Admitting: Internal Medicine

## 2017-06-30 ENCOUNTER — Ambulatory Visit: Payer: 59 | Admitting: Internal Medicine

## 2017-06-30 VITALS — BP 112/76 | HR 78 | Temp 97.9°F | Wt 186.0 lb

## 2017-06-30 DIAGNOSIS — N938 Other specified abnormal uterine and vaginal bleeding: Secondary | ICD-10-CM

## 2017-06-30 DIAGNOSIS — Z9109 Other allergy status, other than to drugs and biological substances: Secondary | ICD-10-CM | POA: Diagnosis not present

## 2017-06-30 DIAGNOSIS — Z23 Encounter for immunization: Secondary | ICD-10-CM

## 2017-06-30 NOTE — Progress Notes (Signed)
HPI  Pt presents to the clinic today to establish care and for management of the conditions listed below. She is transferring care from [facility].  Environmental Allergies: Occurs all year long. She is allergic to pet and animal dander. She has horses, which she knows she is exposing herself to. She is taking Zyrtec daily with good relief.   DUB: She is taking Lysteda. This is being managed by her GYN. She reports she has had a normal pelvic and transvaginal ultrasound.  Flu: 04/2016 Tetanus: 03/2017 Pap Smear: 03/2017  Mammogram: 12/2016 Vision Screening: annually Dentist: biannually  Past Medical History:  Diagnosis Date  . Seasonal allergies     Current Outpatient Medications  Medication Sig Dispense Refill  . cetirizine (ZYRTEC) 10 MG tablet Take 10 mg by mouth daily.    . Multiple Vitamins-Minerals (MULTIPLE VITAMINS/WOMENS) tablet Take 1 tablet by mouth daily.    . OMEGA-3 FATTY ACIDS-VITAMIN E PO Take 1 capsule by mouth daily.    . tranexamic acid (LYSTEDA) 650 MG TABS tablet Take 2 tablets (1,300 mg total) by mouth 3 (three) times daily. Take during menses for a maximum of five days 30 tablet 5   No current facility-administered medications for this visit.     No Known Allergies  Family History  Problem Relation Age of Onset  . Breast cancer Mother   . Pernicious anemia Mother   . Cancer Father        prostate/bladder  . COPD Father   . Breast cancer Maternal Grandmother 59  . Alzheimer's disease Maternal Grandmother   . Breast cancer Maternal Aunt 68    Social History   Socioeconomic History  . Marital status: Married    Spouse name: Not on file  . Number of children: Not on file  . Years of education: Not on file  . Highest education level: Not on file  Social Needs  . Financial resource strain: Not on file  . Food insecurity - worry: Not on file  . Food insecurity - inability: Not on file  . Transportation needs - medical: Not on file  .  Transportation needs - non-medical: Not on file  Occupational History  . Not on file  Tobacco Use  . Smoking status: Never Smoker  . Smokeless tobacco: Never Used  Substance and Sexual Activity  . Alcohol use: Yes    Alcohol/week: 0.0 oz    Comment: occasional  . Drug use: No  . Sexual activity: Yes    Partners: Male    Birth control/protection: Surgical    Comment: husband-vasectomy  Other Topics Concern  . Not on file  Social History Narrative  . Not on file    ROS:  Constitutional: Denies fever, malaise, fatigue, headache or abrupt weight changes.  HEENT: Denies eye pain, eye redness, ear pain, ringing in the ears, wax buildup, runny nose, nasal congestion, bloody nose, or sore throat. Respiratory: Denies difficulty breathing, shortness of breath, cough or sputum production.   Cardiovascular: Denies chest pain, chest tightness, palpitations or swelling in the hands or feet.  Gastrointestinal: Denies abdominal pain, bloating, constipation, diarrhea or blood in the stool.  GU: Pt reports heavy menses. Denies frequency, urgency, pain with urination, blood in urine, odor or discharge. Musculoskeletal: Denies decrease in range of motion, difficulty with gait, muscle pain or joint pain and swelling.  Skin: Denies redness, rashes, lesions or ulcercations.  Neurological: Denies dizziness, difficulty with memory, difficulty with speech or problems with balance and coordination.  Psych: Denies  anxiety, depression, SI/HI.  No other specific complaints in a complete review of systems (except as listed in HPI above).  PE:  Pulse 78   Temp 97.9 F (36.6 C) (Oral)   Wt 186 lb (84.4 kg)   LMP 06/08/2017   SpO2 98%   BMI 30.02 kg/m  Wt Readings from Last 3 Encounters:  06/30/17 186 lb (84.4 kg)  03/24/17 187 lb (84.8 kg)  03/24/16 165 lb (74.8 kg)    General: Appears herstated age, well developed, well nourished in NAD. HEENT: Ears: Tm's gray and intact, normal light  reflex;Throat/Mouth: Teeth present, mucosa pink and moist, no lesions or ulcerations noted.  Neck: Neck supple, trachea midline. No masses, lumps or thyromegaly present.  Cardiovascular: Normal rate and rhythm. S1,S2 noted.  No murmur, rubs or gallops noted.  Pulmonary/Chest: Normal effort and positive vesicular breath sounds. No respiratory distress. No wheezes, rales or ronchi noted.  Abdomen: Soft and nontender. Normal bowel sounds. No distention or masses noted. Neurological: Alert and oriented. Psychiatric: Mood and affect normal. Behavior is normal. Judgment and thought content normal.    BMET    Component Value Date/Time   NA 138 03/24/2016 1325   K 4.0 03/24/2016 1325   CL 105 03/24/2016 1325   CO2 25 03/24/2016 1325   GLUCOSE 104 (H) 03/24/2016 1325   BUN 17 03/24/2016 1325   CREATININE 0.95 03/24/2016 1325   CALCIUM 9.1 03/24/2016 1325   GFRNONAA >60 03/24/2016 1325   GFRAA >60 03/24/2016 1325    Lipid Panel  No results found for: CHOL, TRIG, HDL, CHOLHDL, VLDL, LDLCALC  CBC    Component Value Date/Time   WBC 5.8 03/24/2017 1034   WBC 8.1 03/24/2016 1325   RBC 4.21 03/24/2017 1034   RBC 4.49 03/24/2016 1325   HGB 13.0 03/24/2017 1034   HCT 38.5 03/24/2017 1034   PLT 274 03/24/2017 1034   MCV 91 03/24/2017 1034   MCH 30.9 03/24/2017 1034   MCH 31.3 03/24/2016 1325   MCHC 33.8 03/24/2017 1034   MCHC 34.3 03/24/2016 1325   RDW 13.2 03/24/2017 1034    Hgb A1C No results found for: HGBA1C   Assessment and Plan:

## 2017-07-01 DIAGNOSIS — Z9109 Other allergy status, other than to drugs and biological substances: Secondary | ICD-10-CM | POA: Insufficient documentation

## 2017-07-01 DIAGNOSIS — N938 Other specified abnormal uterine and vaginal bleeding: Secondary | ICD-10-CM | POA: Insufficient documentation

## 2017-07-01 DIAGNOSIS — Z23 Encounter for immunization: Secondary | ICD-10-CM | POA: Insufficient documentation

## 2017-07-01 NOTE — Patient Instructions (Signed)
Allergies An allergy is when your body reacts to a substance in a way that is not normal. An allergic reaction can happen after you:  Eat something.  Breathe in something.  Touch something.  You can be allergic to:  Things that are only around during certain seasons, like molds and pollens.  Foods.  Drugs.  Insects.  Animal dander.  What are the signs or symptoms?  Puffiness (swelling). This may happen on the lips, face, tongue, mouth, or throat.  Sneezing.  Coughing.  Breathing loudly (wheezing).  Stuffy nose.  Tingling in the mouth.  A rash.  Itching.  Itchy, red, puffy areas of skin (hives).  Watery eyes.  Throwing up (vomiting).  Watery poop (diarrhea).  Dizziness.  Feeling faint or fainting.  Trouble breathing or swallowing.  A tight feeling in the chest.  A fast heartbeat. How is this diagnosed? Allergies can be diagnosed with:  A medical and family history.  Skin tests.  Blood tests.  A food diary. A food diary is a record of all the foods, drinks, and symptoms you have each day.  The results of an elimination diet. This diet involves making sure not to eat certain foods and then seeing what happens when you start eating them again.  How is this treated? There is no cure for allergies, but allergic reactions can be treated with medicine. Severe reactions usually need to be treated at a hospital. How is this prevented? The best way to prevent an allergic reaction is to avoid the thing you are allergic to. Allergy shots and medicines can also help prevent reactions in some cases. This information is not intended to replace advice given to you by your health care provider. Make sure you discuss any questions you have with your health care provider. Document Released: 10/23/2012 Document Revised: 02/23/2016 Document Reviewed: 04/09/2014 Elsevier Interactive Patient Education  2018 Elsevier Inc.  

## 2017-07-01 NOTE — Assessment & Plan Note (Signed)
She will continue Lysteda as prescribed She will continue to follow with GYN

## 2017-07-01 NOTE — Assessment & Plan Note (Signed)
Continue Zyrtec daily 

## 2018-01-25 ENCOUNTER — Encounter: Payer: Self-pay | Admitting: Radiology

## 2018-08-24 ENCOUNTER — Encounter: Payer: Self-pay | Admitting: Internal Medicine

## 2018-08-24 ENCOUNTER — Ambulatory Visit: Payer: Managed Care, Other (non HMO) | Admitting: Internal Medicine

## 2018-08-24 VITALS — BP 112/76 | HR 93 | Temp 97.8°F | Wt 202.0 lb

## 2018-08-24 DIAGNOSIS — R51 Headache: Secondary | ICD-10-CM

## 2018-08-24 DIAGNOSIS — F410 Panic disorder [episodic paroxysmal anxiety] without agoraphobia: Secondary | ICD-10-CM

## 2018-08-24 DIAGNOSIS — G44209 Tension-type headache, unspecified, not intractable: Secondary | ICD-10-CM | POA: Diagnosis not present

## 2018-08-24 MED ORDER — KETOROLAC TROMETHAMINE 30 MG/ML IJ SOLN
30.0000 mg | Freq: Once | INTRAMUSCULAR | Status: AC
  Administered 2018-08-24: 30 mg via INTRAMUSCULAR

## 2018-08-24 MED ORDER — HYDROXYZINE HCL 10 MG PO TABS: 10.0000 mg | ORAL_TABLET | Freq: Every day | ORAL | 0 refills | Status: DC | PRN

## 2018-08-25 LAB — COMPREHENSIVE METABOLIC PANEL
ALBUMIN: 4.9 g/dL — AB (ref 3.8–4.8)
ALT: 15 IU/L (ref 0–32)
AST: 19 IU/L (ref 0–40)
Albumin/Globulin Ratio: 2 (ref 1.2–2.2)
Alkaline Phosphatase: 47 IU/L (ref 39–117)
BUN / CREAT RATIO: 13 (ref 9–23)
BUN: 15 mg/dL (ref 6–24)
Bilirubin Total: 0.4 mg/dL (ref 0.0–1.2)
CO2: 22 mmol/L (ref 20–29)
CREATININE: 1.2 mg/dL — AB (ref 0.57–1.00)
Calcium: 10.4 mg/dL — ABNORMAL HIGH (ref 8.7–10.2)
Chloride: 102 mmol/L (ref 96–106)
GFR calc non Af Amer: 53 mL/min/{1.73_m2} — ABNORMAL LOW (ref >59)
GFR, EST AFRICAN AMERICAN: 61 mL/min/{1.73_m2} (ref >59)
GLOBULIN, TOTAL: 2.5 g/dL (ref 1.5–4.5)
GLUCOSE: 118 mg/dL — AB (ref 65–99)
Potassium: 4.6 mmol/L (ref 3.5–5.2)
Sodium: 145 mmol/L — ABNORMAL HIGH (ref 134–144)
TOTAL PROTEIN: 7.4 g/dL (ref 6.0–8.5)

## 2018-08-25 LAB — CBC
HEMATOCRIT: 43.4 % (ref 34.0–46.6)
HEMOGLOBIN: 15.1 g/dL (ref 11.1–15.9)
MCH: 31 pg (ref 26.6–33.0)
MCHC: 34.8 g/dL (ref 31.5–35.7)
MCV: 89 fL (ref 79–97)
Platelets: 322 10*3/uL (ref 150–450)
RBC: 4.87 x10E6/uL (ref 3.77–5.28)
RDW: 12.3 % (ref 11.7–15.4)
WBC: 8 10*3/uL (ref 3.4–10.8)

## 2018-08-25 LAB — TSH: TSH: 1.05 u[IU]/mL (ref 0.450–4.500)

## 2018-08-25 LAB — VITAMIN D 25 HYDROXY (VIT D DEFICIENCY, FRACTURES): Vit D, 25-Hydroxy: 27.9 ng/mL — ABNORMAL LOW (ref 30.0–100.0)

## 2018-08-27 ENCOUNTER — Encounter: Payer: Self-pay | Admitting: Internal Medicine

## 2018-08-27 NOTE — Patient Instructions (Signed)
Panic Attack  A panic attack is when you suddenly feel very afraid, uncomfortable, or nervous (anxious). A panic attack can happen when you are scared or for no reason. A panic attack can feel like a serious problem. It can even feel like a heart attack or stroke. See your doctor when you have a panic attack to make sure you do not have a serious problem. Follow these instructions at home:  Take medicines only as told by your doctor.  If you feel worried or nervous, try not to have caffeine.  Take good care of your health. To do this: ? Eat healthy. Make sure to eat fresh fruits and vegetables, whole grains, lean meats, and low-fat dairy. ? Get enough sleep. Try to sleep for 7-8 hours each night. ? Exercise. Try to be active for 30 minutes 5 or more days a week. ? Do not smoke. Talk to your doctor if you need help quitting. ? Limit how much alcohol you drink:  If you are a woman who is not pregnant: try not to have more than 1 drink a day.  If you are a man: try not to have more than 2 drinks a day.  One drink equals 12 oz of beer, 5 oz of wine, or 1 oz of hard liquor.  Keep all follow-up visits as told by your doctor. This is important. Contact a doctor if:  Your symptoms do not get better.  Your symptoms get worse.  You are not able to take your medicines as told. Get help right away if:  You have thoughts of hurting yourself or others.  You have symptoms of a panic attack. Do not drive yourself to the hospital. Have someone else drive you or call an ambulance. If you feel like you may hurt yourself or others, or have thoughts about taking your own life, get help right away. You can go to your nearest emergency department or call:  Your local emergency services (911 in the [place]  A suicide crisis helpline, such as the National Suicide Prevention Lifeline at 1-800-273-8255. This is open 24 hours a day. Summary  A panic attack is when you suddenly feel very afraid,  uncomfortable, or nervous (anxious).  See your doctor when you have a panic attack to make sure that you do not have another serious problem.  If you feel like you may hurt yourself or others, get help right away by calling 911. This information is not intended to replace advice given to you by your health care provider. Make sure you discuss any questions you have with your health care provider. Document Released: 07/31/2010 Document Revised: 08/11/2016 Document Reviewed: 08/11/2016 Elsevier Interactive Patient Education  2019 Elsevier Inc.  

## 2018-08-27 NOTE — Progress Notes (Signed)
Subjective:    Patient ID: Monica Floyd, female    DOB: [birth year], 48 y.o.   MRN: [id number]  HPI  Pt presents to the clinic today with c/o an anxiety attack. She reports she was sitting at the nail salon getting a pedicure, when all of a sudden she felt a sharp pain in the back of her head. She then felt lightheaded, nauseated, sweaty and felt like her heart was racing. EMS was called. ECG was normal. They advised her that she was having a panic attacks. She reports she feels "fuzzy" about the incident, like she can't really remember it clearly. She is not sure what triggered this. She has never had issues with panic or anxiety in the past. She does not feel depressed. She denies SI/HI.  Review of Systems      Past Medical History:  Diagnosis Date  . Seasonal allergies     Current Outpatient Medications  Medication Sig Dispense Refill  . cetirizine (ZYRTEC) 10 MG tablet Take 10 mg by mouth daily.    . Multiple Vitamins-Minerals (MULTIPLE VITAMINS/WOMENS) tablet Take 1 tablet by mouth daily.    Marland Kitchen omeprazole (PRILOSEC OTC) 20 MG tablet Take 20 mg by mouth daily.    . tranexamic acid (LYSTEDA) 650 MG TABS tablet Take 2 tablets (1,300 mg total) by mouth 3 (three) times daily. Take during menses for a maximum of five days 30 tablet 5  . hydrOXYzine (ATARAX/VISTARIL) 10 MG tablet Take 1 tablet (10 mg total) by mouth daily as needed. 30 tablet 0   No current facility-administered medications for this visit.     No Known Allergies  Family History  Problem Relation Age of Onset  . Breast cancer Mother   . Pernicious anemia Mother   . Cancer Father        prostate/bladder  . COPD Father   . Breast cancer Maternal Grandmother 77  . Alzheimer's disease Maternal Grandmother   . Breast cancer Maternal Aunt 76    Social History   Socioeconomic History  . Marital status: Married    Spouse name: Not on file  . Number of children: Not on file  . Years of education: Not on file  .  Highest education level: Not on file  Occupational History  . Not on file  Social Needs  . Financial resource strain: Not on file  . Food insecurity:    Worry: Not on file    Inability: Not on file  . Transportation needs:    Medical: Not on file    Non-medical: Not on file  Tobacco Use  . Smoking status: Never Smoker  . Smokeless tobacco: Never Used  Substance and Sexual Activity  . Alcohol use: Yes    Alcohol/week: 0.0 standard drinks    Comment: occasional  . Drug use: No  . Sexual activity: Yes    Partners: Male    Birth control/protection: Surgical    Comment: husband-vasectomy  Lifestyle  . Physical activity:    Days per week: Not on file    Minutes per session: Not on file  . Stress: Not on file  Relationships  . Social connections:    Talks on phone: Not on file    Gets together: Not on file    Attends religious service: Not on file    Active member of club or organization: Not on file    Attends meetings of clubs or organizations: Not on file    Relationship status: Not on file  .  Intimate partner violence:    Fear of current or ex partner: Not on file    Emotionally abused: Not on file    Physically abused: Not on file    Forced sexual activity: Not on file  Other Topics Concern  . Not on file  Social History Narrative  . Not on file     Constitutional: Pt reports headache. Denies fever, malaise, fatigue, or abrupt weight changes.  HEENT: Denies eye pain, eye redness, ear pain, ringing in the ears, wax buildup, runny nose, nasal congestion, bloody nose, or sore throat. Respiratory: Denies difficulty breathing, shortness of breath, cough or sputum production.   Cardiovascular: Pt reports palpitations. Denies chest pain, chest tightness, or swelling in the hands or feet.  Gastrointestinal: Denies abdominal pain, bloating, constipation, diarrhea or blood in the stool.  Musculoskeletal: Denies decrease in range of motion, difficulty with gait, muscle pain or  joint pain and swelling.  Neurological: Pt reports lightheadedness. Denies difficulty with memory, difficulty with speech or problems with balance and coordination.  Psych: Pt reports anxiety. Denies depression, SI/HI.  No other specific complaints in a complete review of systems (except as listed in HPI above).  Objective:   Physical Exam   BP 112/76   Pulse 93   Temp 97.8 F (36.6 C) (Oral)   Wt 202 lb (91.6 kg)   LMP 08/21/2018   BMI 32.60 kg/m  Wt Readings from Last 3 Encounters:  08/24/18 202 lb (91.6 kg)  06/30/17 186 lb (84.4 kg)  03/24/17 187 lb (84.8 kg)    General: Appears her stated age, well developed, well nourished in NAD. HEENT: Head: normal shape and size; Eyes: sclera white, no icterus, conjunctiva pink, PERRLA and EOMs intact;  Neck:  Neck supple, trachea midline. No masses, lumps or thyromegaly present.  Cardiovascular: Normal rate and rhythm. S1,S2 noted.  No murmur, rubs or gallops noted. Pulmonary/Chest: Normal effort and positive vesicular breath sounds. No respiratory distress. No wheezes, rales or ronchi noted.  Neurological: Alert and oriented. Coordination normal.  Psychiatric: Mood and affect normal. Behavior is normal. Judgment and thought content normal.     BMET    Component Value Date/Time   NA 145 (H) 08/24/2018 1238   K 4.6 08/24/2018 1238   CL 102 08/24/2018 1238   CO2 22 08/24/2018 1238   GLUCOSE 118 (H) 08/24/2018 1238   GLUCOSE 104 (H) 03/24/2016 1325   BUN 15 08/24/2018 1238   CREATININE 1.20 (H) 08/24/2018 1238   CALCIUM 10.4 (H) 08/24/2018 1238   GFRNONAA 53 (L) 08/24/2018 1238   GFRAA 61 08/24/2018 1238    Lipid Panel  No results found for: CHOL, TRIG, HDL, CHOLHDL, VLDL, LDLCALC  CBC    Component Value Date/Time   WBC 8.0 08/24/2018 1238   WBC 8.1 03/24/2016 1325   RBC 4.87 08/24/2018 1238   RBC 4.49 03/24/2016 1325   HGB 15.1 08/24/2018 1238   HCT 43.4 08/24/2018 1238   PLT 322 08/24/2018 1238   MCV 89  08/24/2018 1238   MCH 31.0 08/24/2018 1238   MCH 31.3 03/24/2016 1325   MCHC 34.8 08/24/2018 1238   MCHC 34.3 03/24/2016 1325   RDW 12.3 08/24/2018 1238    Hgb A1C No results found for: HGBA1C         Assessment & Plan:   Acute Tension Type Headache, Panic Attack:  Toradol 30 mg IM today Will check CBC, CMET, TSH and Vit D today Support offered RX for Hydroxyzine 10 mg daily  prn as needed for anxiety, sedation caution given Offered referral to psychology for CBT but she declines at this time  Return precautions discussed Webb Silversmith, NP

## 2018-08-29 ENCOUNTER — Encounter: Payer: Self-pay | Admitting: Internal Medicine

## 2018-08-29 ENCOUNTER — Other Ambulatory Visit: Payer: Self-pay | Admitting: Internal Medicine

## 2018-08-29 DIAGNOSIS — R7989 Other specified abnormal findings of blood chemistry: Secondary | ICD-10-CM

## 2018-08-29 DIAGNOSIS — R748 Abnormal levels of other serum enzymes: Secondary | ICD-10-CM

## 2018-09-08 ENCOUNTER — Other Ambulatory Visit: Payer: Managed Care, Other (non HMO)

## 2018-09-11 ENCOUNTER — Telehealth: Payer: Self-pay | Admitting: Radiology

## 2018-09-11 ENCOUNTER — Other Ambulatory Visit (INDEPENDENT_AMBULATORY_CARE_PROVIDER_SITE_OTHER): Payer: Managed Care, Other (non HMO)

## 2018-09-11 DIAGNOSIS — R7989 Other specified abnormal findings of blood chemistry: Secondary | ICD-10-CM

## 2018-09-11 NOTE — Addendum Note (Signed)
Addended by: Lurlean Nanny on: 09/11/2018 08:18 AM   Modules accepted: Orders

## 2018-09-11 NOTE — Telephone Encounter (Signed)
LM for patient to contact me, question about a lab test

## 2018-09-12 ENCOUNTER — Other Ambulatory Visit: Payer: Managed Care, Other (non HMO)

## 2018-09-12 DIAGNOSIS — R7989 Other specified abnormal findings of blood chemistry: Secondary | ICD-10-CM

## 2018-09-12 LAB — BASIC METABOLIC PANEL
BUN/Creatinine Ratio: 12 (ref 9–23)
BUN: 14 mg/dL (ref 6–24)
CO2: 23 mmol/L (ref 20–29)
Calcium: 9.5 mg/dL (ref 8.7–10.2)
Chloride: 104 mmol/L (ref 96–106)
Creatinine, Ser: 1.16 mg/dL — ABNORMAL HIGH (ref 0.57–1.00)
GFR calc non Af Amer: 55 mL/min/{1.73_m2} — ABNORMAL LOW (ref >59)
GFR, EST AFRICAN AMERICAN: 64 mL/min/{1.73_m2} (ref >59)
Glucose: 98 mg/dL (ref 65–99)
Potassium: 4.9 mmol/L (ref 3.5–5.2)
Sodium: 140 mmol/L (ref 134–144)

## 2018-09-13 LAB — PARATHYROID HORMONE, INTACT (NO CA): PTH: 31 pg/mL (ref 15–65)

## 2019-06-06 ENCOUNTER — Other Ambulatory Visit: Payer: Self-pay

## 2019-06-06 DIAGNOSIS — Z20822 Contact with and (suspected) exposure to covid-19: Secondary | ICD-10-CM

## 2019-06-07 LAB — NOVEL CORONAVIRUS, NAA: SARS-CoV-2, NAA: NOT DETECTED

## 2019-06-19 ENCOUNTER — Other Ambulatory Visit: Payer: Self-pay

## 2019-06-19 DIAGNOSIS — Z20822 Contact with and (suspected) exposure to covid-19: Secondary | ICD-10-CM

## 2019-06-21 LAB — NOVEL CORONAVIRUS, NAA: SARS-CoV-2, NAA: DETECTED — AB

## 2019-07-13 DIAGNOSIS — I2699 Other pulmonary embolism without acute cor pulmonale: Secondary | ICD-10-CM

## 2019-07-13 HISTORY — DX: Other pulmonary embolism without acute cor pulmonale: I26.99

## 2019-07-21 ENCOUNTER — Other Ambulatory Visit: Payer: Self-pay | Admitting: Internal Medicine

## 2019-07-21 MED ORDER — HYDROXYZINE HCL 10 MG PO TABS: 10.0000 mg | ORAL_TABLET | Freq: Every day | ORAL | 0 refills | Status: DC | PRN

## 2019-08-14 ENCOUNTER — Other Ambulatory Visit: Payer: Self-pay | Admitting: Internal Medicine

## 2019-09-03 DIAGNOSIS — I82409 Acute embolism and thrombosis of unspecified deep veins of unspecified lower extremity: Secondary | ICD-10-CM | POA: Insufficient documentation

## 2019-09-03 DIAGNOSIS — Z86718 Personal history of other venous thrombosis and embolism: Secondary | ICD-10-CM | POA: Insufficient documentation

## 2019-09-05 ENCOUNTER — Ambulatory Visit: Payer: Managed Care, Other (non HMO) | Admitting: Internal Medicine

## 2019-09-14 ENCOUNTER — Other Ambulatory Visit: Payer: Self-pay | Admitting: Internal Medicine

## 2019-10-15 ENCOUNTER — Other Ambulatory Visit: Payer: Self-pay | Admitting: Internal Medicine

## 2019-10-25 ENCOUNTER — Encounter: Payer: Self-pay | Admitting: Internal Medicine

## 2019-10-25 ENCOUNTER — Other Ambulatory Visit: Payer: Self-pay

## 2019-10-25 ENCOUNTER — Ambulatory Visit: Payer: Managed Care, Other (non HMO) | Admitting: Internal Medicine

## 2019-10-25 VITALS — BP 124/82 | HR 80 | Temp 97.7°F | Wt 228.0 lb

## 2019-10-25 DIAGNOSIS — U071 COVID-19: Secondary | ICD-10-CM

## 2019-10-25 DIAGNOSIS — I824Y1 Acute embolism and thrombosis of unspecified deep veins of right proximal lower extremity: Secondary | ICD-10-CM

## 2019-10-25 DIAGNOSIS — K219 Gastro-esophageal reflux disease without esophagitis: Secondary | ICD-10-CM | POA: Diagnosis not present

## 2019-10-25 DIAGNOSIS — I2694 Multiple subsegmental pulmonary emboli without acute cor pulmonale: Secondary | ICD-10-CM

## 2019-10-25 MED ORDER — OMEPRAZOLE 40 MG PO CPDR: 40.0000 mg | DELAYED_RELEASE_CAPSULE | Freq: Every day | ORAL | 3 refills | Status: DC

## 2019-10-25 NOTE — Patient Instructions (Signed)
Pulmonary Embolism  A pulmonary embolism (PE) is a sudden blockage or decrease of blood flow in one or both lungs. Most blockages come from a blood clot that forms in the vein of a lower leg, thigh, or arm (deep vein thrombosis, DVT) and travels to the lungs. A clot is blood that has thickened into a gel or solid. PE is a dangerous and life-threatening condition that needs to be treated right away. What are the causes? This condition is usually caused by a blood clot that forms in a vein and moves to the lungs. In rare cases, it may be caused by air, fat, part of a tumor, or other tissue that moves through the veins and into the lungs. What increases the risk? The following factors may make you more likely to develop this condition:  Experiencing a traumatic injury, such as breaking a hip or leg.  Having: ? A spinal cord injury. ? Orthopedic surgery, especially hip or knee replacement. ? Any major surgery. ? A stroke. ? DVT. ? Blood clots or blood clotting disease. ? Long-term (chronic) lung or heart disease. ? Cancer treated with chemotherapy. ? A central venous catheter.  Taking medicines that contain estrogen. These include birth control pills and hormone replacement therapy.  Being: ? Pregnant. ? In the period of time after your baby is delivered (postpartum). ? Older than age 60. ? Overweight. ? A smoker, especially if you have other risks. What are the signs or symptoms? Symptoms of this condition usually start suddenly and include:  Shortness of breath during activity or at rest.  Coughing, coughing up blood, or coughing up blood-tinged mucus.  Chest pain that is often worse with deep breaths.  Rapid or irregular heartbeat.  Feeling light-headed or dizzy.  Fainting.  Feeling anxious.  Fever.  Sweating.  Pain and swelling in a leg. This is a symptom of DVT, which can lead to PE. How is this diagnosed? This condition may be diagnosed based on:  Your medical  history.  A physical exam.  Blood tests.  CT pulmonary angiogram. This test checks blood flow in and around your lungs.  Ventilation-perfusion scan, also called a lung VQ scan. This test measures air flow and blood flow to the lungs.  An ultrasound of the legs. How is this treated? Treatment for this condition depends on many factors, such as the cause of your PE, your risk for bleeding or developing more clots, and other medical conditions you have. Treatment aims to remove, dissolve, or stop blood clots from forming or growing larger. Treatment may include:  Medicines, such as: ? Blood thinning medicines (anticoagulants) to stop clots from forming. ? Medicines that dissolve clots (thrombolytics).  Procedures, such as: ? Using a flexible tube to remove a blood clot (embolectomy) or to deliver medicine to destroy it (catheter-directed thrombolysis). ? Inserting a filter into a large vein that carries blood to the heart (inferior vena cava). This filter (vena cava filter) catches blood clots before they reach the lungs. ? Surgery to remove the clot (surgical embolectomy). This is rare. You may need a combination of immediate, long-term (up to 3 months after diagnosis), and extended (more than 3 months after diagnosis) treatments. Your treatment may continue for several months (maintenance therapy). You and your health care provider will work together to choose the treatment program that is best for you. Follow these instructions at home: Medicines  Take over-the-counter and prescription medicines only as told by your health care provider.  If you   are taking an anticoagulant medicine: ? Take the medicine every day at the same time each day. ? Understand what foods and drugs interact with your medicine. ? Understand the side effects of this medicine, including excessive bruising or bleeding. Ask your health care provider or pharmacist about other side effects. General  instructions  Wear a medical alert bracelet or carry a medical alert card that says you have had a PE and lists what medicines you take.  Ask your health care provider when you may return to your normal activities. Avoid sitting or lying for a long time without moving.  Maintain a healthy weight. Ask your health care provider what weight is healthy for you.  Do not use any products that contain nicotine or tobacco, such as cigarettes, e-cigarettes, and chewing tobacco. If you need help quitting, ask your health care provider.  Talk with your health care provider about any travel plans. It is important to make sure that you are still able to take your medicine while on trips.  Keep all follow-up visits as told by your health care provider. This is important. Contact a health care provider if:  You missed a dose of your blood thinner medicine. Get help right away if:  You have: ? New or increased pain, swelling, warmth, or redness in an arm or leg. ? Numbness or tingling in an arm or leg. ? Shortness of breath during activity or at rest. ? A fever. ? Chest pain. ? A rapid or irregular heartbeat. ? A severe headache. ? Vision changes. ? A serious fall or accident, or you hit your head. ? Stomach (abdominal) pain. ? Blood in your vomit, stool, or urine. ? A cut that will not stop bleeding.  You cough up blood.  You feel light-headed or dizzy.  You cannot move your arms or legs.  You are confused or have memory loss. These symptoms may represent a serious problem that is an emergency. Do not wait to see if the symptoms will go away. Get medical help right away. Call your local emergency services (911 in the [place] Do not drive yourself to the hospital. Summary  A pulmonary embolism (PE) is a sudden blockage or decrease of blood flow in one or both lungs. PE is a dangerous and life-threatening condition that needs to be treated right away.  Treatments for this condition usually  include medicines to thin your blood (anticoagulants) or medicines to break apart blood clots (thrombolytics).  If you are given blood thinners, it is important to take the medicine every day at the same time each day.  Understand what foods and drugs interact with any medicines that you are taking.  If you have signs of PE or DVT, call your local emergency services (911 in the [place] This information is not intended to replace advice given to you by your health care provider. Make sure you discuss any questions you have with your health care provider. Document Revised: 04/05/2018 Document Reviewed: 04/05/2018 Elsevier Patient Education  2020 [employer]  

## 2019-10-25 NOTE — Progress Notes (Addendum)
Subjective:    Patient ID: Monica Floyd, female    DOB: [birth year], 49 y.o.   MRN: [id number]  HPI  Pt presents to the clinic today for hospital follow up. She tested positive for Covid 06/2019. She had concerns for DVT, so she was seen at [facility] 08/[facility]. They diagnosed her with a RLE DVT and placed her on Eliquis. She ended up travelling to [place] 09/2019 went to the ER for chest pain and shortness of breath. She was diagnosed with 2 PE's on the right. They continued her Eliquis and advised her to follow up with her PCP and [facility]. Since discharge, she reports some intermittent sharp pain in the right calf. She denies redness or swelling. She also reports persistent shortness of breath without chest pain. She has noted that her reflux seems worse and she has a tightening in her throat with some difficulty swallowing. She is taking Omeprazole 20 mg OTC and having to take Tums almost daily on top of that. She has not had a hematology workup. She reports her father had blood clots that were likely provoked secondary to cancer. She has no family history of clotting disorders that she is aware of.   Review of Systems      Past Medical History:  Diagnosis Date  . Seasonal allergies     Current Outpatient Medications  Medication Sig Dispense Refill  . cetirizine (ZYRTEC) 10 MG tablet Take 10 mg by mouth daily.    . hydrOXYzine (ATARAX/VISTARIL) 10 MG tablet Take 1 tablet (10 mg total) by mouth daily as needed. 30 tablet 0  . Multiple Vitamins-Minerals (MULTIPLE VITAMINS/WOMENS) tablet Take 1 tablet by mouth daily.    Marland Kitchen omeprazole (PRILOSEC OTC) 20 MG tablet Take 20 mg by mouth daily.    . tranexamic acid (LYSTEDA) 650 MG TABS tablet Take 2 tablets (1,300 mg total) by mouth 3 (three) times daily. Take during menses for a maximum of five days 30 tablet 5   No current facility-administered medications for this visit.    No Known Allergies  Family History   Problem Relation Age of Onset  . Breast cancer Mother   . Pernicious anemia Mother   . Cancer Father        prostate/bladder  . COPD Father   . Breast cancer Maternal Grandmother 56  . Alzheimer's disease Maternal Grandmother   . Breast cancer Maternal Aunt 61    Social History   Socioeconomic History  . Marital status: Married    Spouse name: Not on file  . Number of children: Not on file  . Years of education: Not on file  . Highest education level: Not on file  Occupational History  . Not on file  Tobacco Use  . Smoking status: Never Smoker  . Smokeless tobacco: Never Used  Substance and Sexual Activity  . Alcohol use: Yes    Alcohol/week: 0.0 standard drinks    Comment: occasional  . Drug use: No  . Sexual activity: Yes    Partners: Male    Birth control/protection: Surgical    Comment: husband-vasectomy  Other Topics Concern  . Not on file  Social History Narrative  . Not on file   Social Determinants of Health   Financial Resource Strain:   . Difficulty of Paying Living Expenses:   Food Insecurity:   . Worried About Charity fundraiser in the Last Year:   . [place] in the Last  Year:   Transportation Needs:   . Film/video editor (Medical):   Marland Kitchen Lack of Transportation (Non-Medical):   Physical Activity:   . Days of Exercise per Week:   . Minutes of Exercise per Session:   Stress:   . Feeling of Stress :   Social Connections:   . Frequency of Communication with Friends and Family:   . Frequency of Social Gatherings with Friends and Family:   . Attends Religious Services:   . Active Member of Clubs or Organizations:   . Attends Archivist Meetings:   Marland Kitchen Marital Status:   Intimate Partner Violence:   . Fear of Current or Ex-Partner:   . Emotionally Abused:   Marland Kitchen Physically Abused:   . Sexually Abused:      Constitutional: Denies fever, malaise, fatigue, headache or abrupt weight changes.  Respiratory: Pt reports intermittent  shortness of breath. Denies difficulty breathing, cough or sputum production.   Cardiovascular: Pt reports pain in right calf. Denies chest pain, chest tightness, palpitations or swelling in the hands or feet.  Gastrointestinal: Pt reports reflux. Denies abdominal pain, bloating, constipation, diarrhea or blood in the stool.  Musculoskeletal: Denies decrease in range of motion, difficulty with gait, muscle pain or joint pain and swelling.  Skin: Denies redness, rashes, lesions or ulcercations.    No other specific complaints in a complete review of systems (except as listed in HPI above).  Objective:   Physical Exam  BP 124/82   Pulse 80   Temp 97.7 F (36.5 C) (Temporal)   Wt 228 lb (103.4 kg)   SpO2 98%   BMI 36.80 kg/m   Wt Readings from Last 3 Encounters:  08/24/18 202 lb (91.6 kg)  06/30/17 186 lb (84.4 kg)  03/24/17 187 lb (84.8 kg)    General: Appears her stated age, obese, in NAD. Skin: Warm, dry and intact. No redness or warmth noted. Cardiovascular: Normal rate and rhythm. S1,S2 noted.  No murmur, rubs or gallops noted. No BLE edema. Positive Homan's sign RLE. Pulmonary/Chest: Normal effort and positive vesicular breath sounds. No respiratory distress. No wheezes, rales or ronchi noted.  Abdomen: Soft and nontender. Normal bowel sounds. No distention or masses noted.  Musculoskeletal: No difficulty with gait.  Neurological: Alert and oriented.    BMET    Component Value Date/Time   NA 140 09/11/2018 0821   K 4.9 09/11/2018 0821   CL 104 09/11/2018 0821   CO2 23 09/11/2018 0821   GLUCOSE 98 09/11/2018 0821   GLUCOSE 104 (H) 03/24/2016 1325   BUN 14 09/11/2018 0821   CREATININE 1.16 (H) 09/11/2018 0821   CALCIUM 9.5 09/11/2018 0821   GFRNONAA 55 (L) 09/11/2018 0821   GFRAA 64 09/11/2018 0821    Lipid Panel  No results found for: CHOL, TRIG, HDL, CHOLHDL, VLDL, LDLCALC  CBC    Component Value Date/Time   WBC 8.0 08/24/2018 1238   WBC 8.1 03/24/2016  1325   RBC 4.87 08/24/2018 1238   RBC 4.49 03/24/2016 1325   HGB 15.1 08/24/2018 1238   HCT 43.4 08/24/2018 1238   PLT 322 08/24/2018 1238   MCV 89 08/24/2018 1238   MCH 31.0 08/24/2018 1238   MCH 31.3 03/24/2016 1325   MCHC 34.8 08/24/2018 1238   MCHC 34.3 03/24/2016 1325   RDW 12.3 08/24/2018 1238    Hgb A1C No results found for: HGBA1C          Assessment & Plan:  ER Follow Up for DVT  RLE, PE's on the Right:  Will request ER notes, labs and imaging Will continue Eliquis 5 mg BID CBC and BMET today Referral to hematology for clotting workup She wants to hold off on repeat ultrasound and CTA of the chest  She will keep her scheduled follow up with [facility]  GERD:  Increase Omeprazole to 40 mg daily Continue Tums as needed Avoid foods that trigger reflux Weight loss may improve reflux symptoms  RTC in 1 month for her annual exam  Webb Silversmith, NP This visit occurred during the SARS-CoV-2 public health emergency.  Safety protocols were in place, including screening questions prior to the visit, additional usage of staff PPE, and extensive cleaning of exam room while observing appropriate contact time as indicated for disinfecting solutions.

## 2019-10-26 LAB — BASIC METABOLIC PANEL
BUN/Creatinine Ratio: 13 (ref 9–23)
BUN: 15 mg/dL (ref 6–24)
CO2: 23 mmol/L (ref 20–29)
Calcium: 9.5 mg/dL (ref 8.7–10.2)
Chloride: 100 mmol/L (ref 96–106)
Creatinine, Ser: 1.15 mg/dL — ABNORMAL HIGH (ref 0.57–1.00)
GFR calc Af Amer: 64 mL/min/{1.73_m2} (ref >59)
GFR calc non Af Amer: 56 mL/min/{1.73_m2} — ABNORMAL LOW (ref >59)
Glucose: 87 mg/dL (ref 65–99)
Potassium: 4.6 mmol/L (ref 3.5–5.2)
Sodium: 138 mmol/L (ref 134–144)

## 2019-10-26 LAB — CBC
Hematocrit: 43 % (ref 34.0–46.6)
Hemoglobin: 15.1 g/dL (ref 11.1–15.9)
MCH: 31.5 pg (ref 26.6–33.0)
MCHC: 35.1 g/dL (ref 31.5–35.7)
MCV: 90 fL (ref 79–97)
Platelets: 289 10*3/uL (ref 150–450)
RBC: 4.79 x10E6/uL (ref 3.77–5.28)
RDW: 12.8 % (ref 11.7–15.4)
WBC: 7.7 10*3/uL (ref 3.4–10.8)

## 2019-11-01 ENCOUNTER — Other Ambulatory Visit: Payer: Self-pay | Admitting: Obstetrics & Gynecology

## 2019-11-01 DIAGNOSIS — Z1231 Encounter for screening mammogram for malignant neoplasm of breast: Secondary | ICD-10-CM

## 2019-11-02 ENCOUNTER — Other Ambulatory Visit: Payer: Self-pay

## 2019-11-02 ENCOUNTER — Encounter: Payer: Self-pay | Admitting: Oncology

## 2019-11-02 ENCOUNTER — Inpatient Hospital Stay: Payer: Managed Care, Other (non HMO) | Attending: Oncology | Admitting: Oncology

## 2019-11-02 ENCOUNTER — Inpatient Hospital Stay: Payer: Managed Care, Other (non HMO)

## 2019-11-02 VITALS — BP 130/88 | HR 88 | Temp 97.8°F | Resp 18 | Wt 230.0 lb

## 2019-11-02 DIAGNOSIS — Z86718 Personal history of other venous thrombosis and embolism: Secondary | ICD-10-CM | POA: Diagnosis not present

## 2019-11-02 DIAGNOSIS — K219 Gastro-esophageal reflux disease without esophagitis: Secondary | ICD-10-CM | POA: Diagnosis not present

## 2019-11-02 DIAGNOSIS — I2699 Other pulmonary embolism without acute cor pulmonale: Secondary | ICD-10-CM

## 2019-11-02 DIAGNOSIS — Z7901 Long term (current) use of anticoagulants: Secondary | ICD-10-CM | POA: Insufficient documentation

## 2019-11-02 DIAGNOSIS — Z803 Family history of malignant neoplasm of breast: Secondary | ICD-10-CM | POA: Insufficient documentation

## 2019-11-02 DIAGNOSIS — Z86711 Personal history of pulmonary embolism: Secondary | ICD-10-CM | POA: Diagnosis not present

## 2019-11-02 DIAGNOSIS — Z8616 Personal history of COVID-19: Secondary | ICD-10-CM | POA: Insufficient documentation

## 2019-11-02 DIAGNOSIS — Z8042 Family history of malignant neoplasm of prostate: Secondary | ICD-10-CM | POA: Insufficient documentation

## 2019-11-02 DIAGNOSIS — Z79899 Other long term (current) drug therapy: Secondary | ICD-10-CM | POA: Insufficient documentation

## 2019-11-02 DIAGNOSIS — R0602 Shortness of breath: Secondary | ICD-10-CM | POA: Insufficient documentation

## 2019-11-02 DIAGNOSIS — Z8052 Family history of malignant neoplasm of bladder: Secondary | ICD-10-CM | POA: Insufficient documentation

## 2019-11-02 LAB — HEPATIC FUNCTION PANEL
ALT: 16 U/L (ref 0–44)
AST: 14 U/L — ABNORMAL LOW (ref 15–41)
Albumin: 4.1 g/dL (ref 3.5–5.0)
Alkaline Phosphatase: 40 U/L (ref 38–126)
Bilirubin, Direct: <0.1 mg/dL (ref 0.0–0.2)
Total Bilirubin: 0.7 mg/dL (ref 0.3–1.2)
Total Protein: 7.4 g/dL (ref 6.5–8.1)

## 2019-11-02 NOTE — Progress Notes (Signed)
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  Still has SOBr with occasional pains.  DVT as seen in right leg now having similar pain in left leg for 1.5 weeks.

## 2019-11-03 LAB — ANTIPHOSPHOLIPID SYNDROME PROF
Anticardiolipin IgG: <9 GPL U/mL (ref 0–14)
Anticardiolipin IgM: 10 MPL U/mL (ref 0–12)
DRVVT: 46.9 s (ref 0.0–47.0)
PTT Lupus Anticoagulant: 30.1 s (ref 0.0–51.9)

## 2019-11-03 LAB — PROTEIN S, TOTAL: Protein S Ag, Total: 97 % (ref 60–150)

## 2019-11-03 LAB — PROTEIN C ACTIVITY: Protein C Activity: 107 % (ref 73–180)

## 2019-11-03 LAB — PROTEIN S ACTIVITY: Protein S Activity: 115 % (ref 63–140)

## 2019-11-03 NOTE — Progress Notes (Signed)
Hematology/Oncology Consult note [facility] Telephone:([phone] [phone]   Patient Care Team: Jearld Fenton, NP as PCP - General (Internal Medicine)  REFERRING PROVIDER: Jearld Fenton, NP  CHIEF COMPLAINTS/REASON FOR VISIT:  Evaluation of acute DVT and PE  HISTORY OF PRESENTING ILLNESS:   Monica Floyd is a  49 y.o.  female with PMH listed below was seen in consultation at the request of  Jearld Fenton, NP  for evaluation of acute DVT and PE Went to see vascular surgeon on 09/03/2019 for evaluation of significant right lower extremity discomfort.  She had Covid 19 infection in December 2001.  Prior to the onset of symptoms, had long distance driving to and from [place].  She did take 2 breaks during the 7-hour 1 with driving.  She had no chest pain or shortness of breath at that time.  Patient had duplex ultrasound study done at vascular surgery's office which showed decreased compressibility of the gastrocnemius veins and posterior tibial veins of the right calf.  Popliteal vein, tibial veins and femoral vein also had full compressibility. Patient was diagnosed with subacute DVT involving right lower extremity and was recommended to start Eliquis 10 mg twice daily for a week followed by Eliquis 5 mg twice daily.  Patient reports that her symptoms improved after started on anticoagulation. However when she went to [place] in March 2021 she had experienced progressively worsening chest pain and she presented to a local hospital in [place].  Patient recalls that when she was diagnosed with DVT, she may have had some chest discomfort however due to the severe right lower extremity tenderness and discomfort, she was concentrated on that and ignored her chest symptoms.  Patient had CT angio chest pulmonary embolism protocol done on 09/26/2019 which showed small amount of pulmonary emboli within segmental and subsegmental pulmonary arteries predominantly in  the right lower lobe. Patient was continued on Eliquis as physician in [place] felt that patient may have had pulmonary embolism around the same time of diagnosis of DVT.  CT angiogram was not done initially.  This was not considered to be on Eliquis failure.  Patient came back to [place] to follow-up with primary care provider and was referred to hematology for further evaluation and discussion.  Today patient reports that her right lower extremity tenderness has significantly improved.  She still has some mild shortness of breath with exertion.  No chest pain. Denies any previous history of blood clots.  Father have a history of provoked DVT.   Review of Systems  Constitutional: Negative for appetite change, chills, fatigue and fever.  HENT:   Negative for hearing loss and voice change.   Eyes: Negative for eye problems.  Respiratory: Negative for chest tightness and cough.        Mild shortness of breath with exertion  Cardiovascular: Negative for chest pain.  Gastrointestinal: Negative for abdominal distention, abdominal pain and blood in stool.  Endocrine: Negative for hot flashes.  Genitourinary: Negative for difficulty urinating and frequency.   Musculoskeletal: Negative for arthralgias.  Skin: Negative for itching and rash.  Neurological: Negative for extremity weakness.  Hematological: Negative for adenopathy.  Psychiatric/Behavioral: Negative for confusion.    MEDICAL HISTORY:  Past Medical History:  Diagnosis Date  . GERD (gastroesophageal reflux disease)   . Seasonal allergies     SURGICAL HISTORY: Past Surgical History:  Procedure Laterality Date  . KNEE ARTHROSCOPY W/ ACL RECONSTRUCTION Right 2012    SOCIAL HISTORY: Social  History   Socioeconomic History  . Marital status: Married    Spouse name: Not on file  . Number of children: Not on file  . Years of education: Not on file  . Highest education level: Not on file  Occupational History  . Not on  file  Tobacco Use  . Smoking status: Never Smoker  . Smokeless tobacco: Never Used  Substance and Sexual Activity  . Alcohol use: Yes    Alcohol/week: 0.0 standard drinks    Comment: occasional  . Drug use: No  . Sexual activity: Yes    Partners: Male    Birth control/protection: Surgical    Comment: husband-vasectomy  Other Topics Concern  . Not on file  Social History Narrative  . Not on file   Social Determinants of Health   Financial Resource Strain:   . Difficulty of Paying Living Expenses:   Food Insecurity:   . Worried About Charity fundraiser in the Last Year:   . Arboriculturist in the Last Year:   Transportation Needs:   . Film/video editor (Medical):   Marland Kitchen Lack of Transportation (Non-Medical):   Physical Activity:   . Days of Exercise per Week:   . Minutes of Exercise per Session:   Stress:   . Feeling of Stress :   Social Connections:   . Frequency of Communication with Friends and Family:   . Frequency of Social Gatherings with Friends and Family:   . Attends Religious Services:   . Active Member of Clubs or Organizations:   . Attends Archivist Meetings:   Marland Kitchen Marital Status:   Intimate Partner Violence:   . Fear of Current or Ex-Partner:   . Emotionally Abused:   Marland Kitchen Physically Abused:   . Sexually Abused:     FAMILY HISTORY: Family History  Problem Relation Age of Onset  . Breast cancer Mother   . Pernicious anemia Mother   . Cancer Father        prostate/bladder  . COPD Father   . Breast cancer Maternal Grandmother 47  . Alzheimer's disease Maternal Grandmother   . Breast cancer Maternal Aunt 5    ALLERGIES:  has No Known Allergies.  MEDICATIONS:  Current Outpatient Medications  Medication Sig Dispense Refill  . cetirizine (ZYRTEC) 10 MG tablet Take 10 mg by mouth daily.    Marland Kitchen ELIQUIS 5 MG TABS tablet Take 5 mg by mouth 2 (two) times daily.    . hydrOXYzine (ATARAX/VISTARIL) 10 MG tablet Take 1 tablet (10 mg total) by mouth  daily as needed. 30 tablet 0  . Multiple Vitamins-Minerals (MULTIPLE VITAMINS/WOMENS) tablet Take 1 tablet by mouth daily.    Marland Kitchen omeprazole (PRILOSEC) 40 MG capsule Take 1 capsule (40 mg total) by mouth daily. 30 capsule 3   No current facility-administered medications for this visit.     PHYSICAL EXAMINATION: ECOG PERFORMANCE STATUS: 1 - Symptomatic but completely ambulatory Vitals:   11/02/19 0937  BP: 130/88  Pulse: 88  Resp: 18  Temp: 97.8 F (36.6 C)  SpO2: 98%   Filed Weights   11/02/19 0937  Weight: 230 lb (104.3 kg)    Physical Exam Constitutional:      General: She is not in acute distress. HENT:     Head: Normocephalic and atraumatic.  Eyes:     General: No scleral icterus. Cardiovascular:     Rate and Rhythm: Normal rate and regular rhythm.     Heart sounds: Normal heart  sounds.  Pulmonary:     Effort: Pulmonary effort is normal. No respiratory distress.     Breath sounds: No wheezing.  Abdominal:     General: Bowel sounds are normal. There is no distension.     Palpations: Abdomen is soft.  Musculoskeletal:        General: No deformity. Normal range of motion.     Cervical back: Normal range of motion and neck supple.     Comments: Trace right lower extremity edema  Skin:    General: Skin is warm and dry.     Findings: No erythema or rash.  Neurological:     Mental Status: She is alert and oriented to person, place, and time. Mental status is at baseline.     Cranial Nerves: No cranial nerve deficit.     Coordination: Coordination normal.  Psychiatric:        Mood and Affect: Mood normal.     LABORATORY DATA:  I have reviewed the data as listed Lab Results  Component Value Date   WBC 7.7 10/25/2019   HGB 15.1 10/25/2019   HCT 43.0 10/25/2019   MCV 90 10/25/2019   PLT 289 10/25/2019   Recent Labs    10/25/19 0845 11/02/19 1045  NA 138  --   K 4.6  --   CL 100  --   CO2 23  --   GLUCOSE 87  --   BUN 15  --   CREATININE 1.15*  --     CALCIUM 9.5  --   GFRNONAA 56*  --   GFRAA 64  --   PROT  --  7.4  ALBUMIN  --  4.1  AST  --  14*  ALT  --  16  ALKPHOS  --  40  BILITOT  --  0.7  BILIDIR  --  <0.1  IBILI  --  NOT CALCULATED   Iron/TIBC/Ferritin/ %Sat No results found for: IRON, TIBC, FERRITIN, IRONPCTSAT    RADIOGRAPHIC STUDIES: I have personally reviewed the radiological images as listed and agreed with the findings in the report. No results found.    ASSESSMENT & PLAN:  1. History of pulmonary embolism   2. History of deep vein thrombosis (DVT) of lower extremity    Discussed with patient that from her history, it is possible that her right lower extremity DVT and pulmonary embolism or provoked due to the immobilization from driving trip.  Recent COVID-19 infection may have also caused her to have a hypercoagulable state. She does have family history of thrombosis. I recommend patient continue Eliquis 5 mg twice daily for now.  Recommend finish at least 6 months of anticoagulation. Patient is 49 years of age, and now out of the acute setting, I will obtain hypercoagulable work-up including factor V Leiden mutation, prothrombin gene mutation, antiphospholipid syndrome profile, protein C&S. Mild shortness of breath with exertion, possible due to consequence of pulmonary embolism. Continue close monitor.  If her symptoms getting worse, I will repeat the CT angiogram for follow-up.  Orders Placed This Encounter  Procedures  . Hepatic function panel    Standing Status:   Future    Number of Occurrences:   1    Standing Expiration Date:   11/01/2020  . Factor 5 leiden    Standing Status:   Future    Number of Occurrences:   1    Standing Expiration Date:   11/01/2020  . Prothrombin gene mutation    Standing Status:  Future    Number of Occurrences:   1    Standing Expiration Date:   11/01/2020  . ANTIPHOSPHOLIPID SYNDROME PROF    Standing Status:   Future    Number of Occurrences:   1    Standing  Expiration Date:   11/01/2020  . Protein C activity    Standing Status:   Future    Number of Occurrences:   1    Standing Expiration Date:   11/01/2020  . Protein C, total    Standing Status:   Future    Number of Occurrences:   1    Standing Expiration Date:   11/01/2020  . Protein S, total    Standing Status:   Future    Number of Occurrences:   1    Standing Expiration Date:   11/01/2020  . Protein S activity    Standing Status:   Future    Number of Occurrences:   1    Standing Expiration Date:   11/01/2020    All questions were answered. The patient knows to call the clinic with any problems questions or concerns.  cc Jearld Fenton, NP    Return of visit: 3 months Thank you for this kind referral and the opportunity to participate in the care of this patient. A copy of today's note is routed to referring provider    Earlie Server, MD, PhD Hematology Oncology [facility] Pager- [phone] 11/03/2019

## 2019-11-04 LAB — PROTEIN C, TOTAL: Protein C, Total: 104 % (ref 60–150)

## 2019-11-06 LAB — PROTHROMBIN GENE MUTATION

## 2019-11-07 LAB — FACTOR 5 LEIDEN

## 2019-11-09 ENCOUNTER — Encounter: Payer: Self-pay | Admitting: Internal Medicine

## 2019-11-09 ENCOUNTER — Other Ambulatory Visit: Payer: Self-pay

## 2019-11-09 ENCOUNTER — Ambulatory Visit (INDEPENDENT_AMBULATORY_CARE_PROVIDER_SITE_OTHER): Payer: Managed Care, Other (non HMO) | Admitting: Internal Medicine

## 2019-11-09 VITALS — BP 124/82 | HR 87 | Temp 98.0°F | Ht 65.5 in | Wt 232.0 lb

## 2019-11-09 DIAGNOSIS — Z0000 Encounter for general adult medical examination without abnormal findings: Secondary | ICD-10-CM | POA: Diagnosis not present

## 2019-11-09 DIAGNOSIS — K219 Gastro-esophageal reflux disease without esophagitis: Secondary | ICD-10-CM

## 2019-11-09 DIAGNOSIS — Z1211 Encounter for screening for malignant neoplasm of colon: Secondary | ICD-10-CM | POA: Diagnosis not present

## 2019-11-09 DIAGNOSIS — R0989 Other specified symptoms and signs involving the circulatory and respiratory systems: Secondary | ICD-10-CM

## 2019-11-09 DIAGNOSIS — R6889 Other general symptoms and signs: Secondary | ICD-10-CM

## 2019-11-09 DIAGNOSIS — I82409 Acute embolism and thrombosis of unspecified deep veins of unspecified lower extremity: Secondary | ICD-10-CM | POA: Diagnosis not present

## 2019-11-09 DIAGNOSIS — Z86711 Personal history of pulmonary embolism: Secondary | ICD-10-CM | POA: Insufficient documentation

## 2019-11-09 DIAGNOSIS — I2699 Other pulmonary embolism without acute cor pulmonale: Secondary | ICD-10-CM

## 2019-11-09 DIAGNOSIS — F419 Anxiety disorder, unspecified: Secondary | ICD-10-CM | POA: Diagnosis not present

## 2019-11-09 MED ORDER — OMEPRAZOLE 40 MG PO CPDR
40.0000 mg | DELAYED_RELEASE_CAPSULE | Freq: Every day | ORAL | 2 refills | Status: DC
Start: 2019-11-09 — End: 2020-04-22

## 2019-11-09 NOTE — Patient Instructions (Signed)

## 2019-11-09 NOTE — Assessment & Plan Note (Signed)
Continue Eliquis Follow up with hematology as scheduled

## 2019-11-09 NOTE — Assessment & Plan Note (Signed)
Increase Omeprazole to 40 mg BID CBC and CMET today Referral to GI for colonoscopy and upper GI

## 2019-11-09 NOTE — Assessment & Plan Note (Signed)
Continue Hydroxyzine prn Will monitor 

## 2019-11-09 NOTE — Progress Notes (Signed)
Subjective:    Patient ID: Monica Floyd, female    DOB: 1971-02-22, 49 y.o.   MRN: [id number]  HPI  Patient presents to the clinic today for her annual exam.  She is also due to follow-up chronic conditions.  GERD: She denies breakthrough on Omeprazole but reports a tightness in her throat. She noticed this 3-4 months ago.  There is no upper GI on file.  Anxiety: She takes Hydroxyzine as needed with good relief.  She is not seeing a therapist at this time.  She denies depression, SI/HI.  DVT/PE: S/P Covid.  She is taking Eliquis as prescribed.  Flu: 06/2017 Tetanus: 03/2017 Covid: 09/2019, 10/2019 Pap smear: 03/2017 Mammogram: 12/2016, scheduled 11/2019 Colon screening: never Vision screening: annually Dentist: biannually  Diet: She does eat meat. She consume fruits and veggies daily. She tries to avoid fried foods. She drinks mostly water. Exercise: None  Review of Systems      Past Medical History:  Diagnosis Date  . GERD (gastroesophageal reflux disease)   . Seasonal allergies     Current Outpatient Medications  Medication Sig Dispense Refill  . cetirizine (ZYRTEC) 10 MG tablet Take 10 mg by mouth daily.    Marland Kitchen ELIQUIS 5 MG TABS tablet Take 5 mg by mouth 2 (two) times daily.    . hydrOXYzine (ATARAX/VISTARIL) 10 MG tablet Take 1 tablet (10 mg total) by mouth daily as needed. 30 tablet 0  . Multiple Vitamins-Minerals (MULTIPLE VITAMINS/WOMENS) tablet Take 1 tablet by mouth daily.    Marland Kitchen omeprazole (PRILOSEC) 40 MG capsule Take 1 capsule (40 mg total) by mouth daily. 30 capsule 3   No current facility-administered medications for this visit.    No Known Allergies  Family History  Problem Relation Age of Onset  . Breast cancer Mother   . Pernicious anemia Mother   . Cancer Father        prostate/bladder  . COPD Father   . Breast cancer Maternal Grandmother 65  . Alzheimer's disease Maternal Grandmother   . Breast cancer Maternal Aunt 74    Social History    Socioeconomic History  . Marital status: Married    Spouse name: Not on file  . Number of children: Not on file  . Years of education: Not on file  . Highest education level: Not on file  Occupational History  . Not on file  Tobacco Use  . Smoking status: Never Smoker  . Smokeless tobacco: Never Used  Substance and Sexual Activity  . Alcohol use: Yes    Alcohol/week: 0.0 standard drinks    Comment: occasional  . Drug use: No  . Sexual activity: Yes    Partners: Male    Birth control/protection: Surgical    Comment: husband-vasectomy  Other Topics Concern  . Not on file  Social History Narrative  . Not on file   Social Determinants of Health   Financial Resource Strain:   . Difficulty of Paying Living Expenses:   Food Insecurity:   . Worried About Charity fundraiser in the Last Year:   . Arboriculturist in the Last Year:   Transportation Needs:   . Film/video editor (Medical):   Marland Kitchen Lack of Transportation (Non-Medical):   Physical Activity:   . Days of Exercise per Week:   . Minutes of Exercise per Session:   Stress:   . Feeling of Stress :   Social Connections:   . Frequency of Communication with Friends and Family:   .  Frequency of Social Gatherings with Friends and Family:   . Attends Religious Services:   . Active Member of Clubs or Organizations:   . Attends Archivist Meetings:   Marland Kitchen Marital Status:   Intimate Partner Violence:   . Fear of Current or Ex-Partner:   . Emotionally Abused:   Marland Kitchen Physically Abused:   . Sexually Abused:      Constitutional: Denies fever, malaise, fatigue, headache or abrupt weight changes.  HEENT: Denies eye pain, eye redness, ear pain, ringing in the ears, wax buildup, runny nose, nasal congestion, bloody nose, or sore throat. Respiratory: Denies difficulty breathing, shortness of breath, cough or sputum production.   Cardiovascular: Denies chest pain, chest tightness, palpitations or swelling in the hands or  feet.  Gastrointestinal: Pt reports tightness in throat. Denies abdominal pain, bloating, constipation, diarrhea or blood in the stool.  GU: Denies urgency, frequency, pain with urination, burning sensation, blood in urine, odor or discharge. Musculoskeletal: Denies decrease in range of motion, difficulty with gait, muscle pain or joint pain and swelling.  Skin: Denies redness, rashes, lesions or ulcercations.  Neurological: Denies dizziness, difficulty with memory, difficulty with speech or problems with balance and coordination.  Psych: Pt has a history of anxiety. Denies depression, SI/HI.  No other specific complaints in a complete review of systems (except as listed in HPI above).  Objective:   Physical Exam   BP 124/82   Pulse 87   Temp 98 F (36.7 C) (Temporal)   Ht 5' 5.5" (1.664 m)   Wt 232 lb (105.2 kg)   LMP 11/04/2019   BMI 38.02 kg/m   Wt Readings from Last 3 Encounters:  11/02/19 230 lb (104.3 kg)  10/25/19 228 lb (103.4 kg)  08/24/18 202 lb (91.6 kg)    General: Appears her stated age, obese, in NAD. Skin: Warm, dry and intact. No rashesnoted. HEENT: Head: normal shape and size; Eyes: sclera white, no icterus, conjunctiva pink, PERRLA and EOMs intact;  Neck:  Neck supple, trachea midline. No masses, lumps or thyromegaly present.  Cardiovascular: Normal rate and rhythm. S1,S2 noted.  No murmur, rubs or gallops noted. No JVD or BLE edema. No carotid bruits noted. Pulmonary/Chest: Normal effort and positive vesicular breath sounds. No respiratory distress. No wheezes, rales or ronchi noted.  Abdomen: Soft and nontender. Normal bowel sounds. No distention or masses noted. Liver, spleen and kidneys non palpable. Musculoskeletal: Strength 5/5 BUE/BLE. No difficulty with gait.  Neurological: Alert and oriented. Cranial nerves II-XII grossly intact. Coordination normal.  Psychiatric: Mood and affect normal. Behavior is normal. Judgment and thought content normal.      BMET    Component Value Date/Time   NA 138 10/25/2019 0845   K 4.6 10/25/2019 0845   CL 100 10/25/2019 0845   CO2 23 10/25/2019 0845   GLUCOSE 87 10/25/2019 0845   GLUCOSE 104 (H) 03/24/2016 1325   BUN 15 10/25/2019 0845   CREATININE 1.15 (H) 10/25/2019 0845   CALCIUM 9.5 10/25/2019 0845   GFRNONAA 56 (L) 10/25/2019 0845   GFRAA 64 10/25/2019 0845    Lipid Panel  No results found for: CHOL, TRIG, HDL, CHOLHDL, VLDL, LDLCALC  CBC    Component Value Date/Time   WBC 7.7 10/25/2019 0845   WBC 8.1 03/24/2016 1325   RBC 4.79 10/25/2019 0845   RBC 4.49 03/24/2016 1325   HGB 15.1 10/25/2019 0845   HCT 43.0 10/25/2019 0845   PLT 289 10/25/2019 0845   MCV 90 10/25/2019 0845  MCH 31.5 10/25/2019 0845   MCH 31.3 03/24/2016 1325   MCHC 35.1 10/25/2019 0845   MCHC 34.3 03/24/2016 1325   RDW 12.8 10/25/2019 0845    Hgb A1C No results found for: HGBA1C         Assessment & Plan:  Preventative Health Maintenance:  Encouraged her to get a flu shot in the fall Tetanus UTD Covid vaccine UTD Pap smear UTD Mammogram scheduled Referral to GI for colon screening/UGI Encouraged her to consume a balanced diet and exercise regimen Advised her to see an eye doctor and dentist annually Will check CBC, CMET, Lipid and Vit D today  RTC in 1 year, sooner if needed    Webb Silversmith, NP This visit occurred during the SARS-CoV-2 public health emergency.  Safety protocols were in place, including screening questions prior to the visit, additional usage of staff PPE, and extensive cleaning of exam room while observing appropriate contact time as indicated for disinfecting solutions.

## 2019-11-10 LAB — CBC
Hematocrit: 36.5 % (ref 34.0–46.6)
Hemoglobin: 12.6 g/dL (ref 11.1–15.9)
MCH: 30.7 pg (ref 26.6–33.0)
MCHC: 34.5 g/dL (ref 31.5–35.7)
MCV: 89 fL (ref 79–97)
Platelets: 293 10*3/uL (ref 150–450)
RBC: 4.1 x10E6/uL (ref 3.77–5.28)
RDW: 12.6 % (ref 11.7–15.4)
WBC: 8.3 10*3/uL (ref 3.4–10.8)

## 2019-11-10 LAB — COMPREHENSIVE METABOLIC PANEL
ALT: 13 IU/L (ref 0–32)
AST: 17 IU/L (ref 0–40)
Albumin/Globulin Ratio: 2.1 (ref 1.2–2.2)
Albumin: 4.4 g/dL (ref 3.8–4.8)
Alkaline Phosphatase: 60 IU/L (ref 39–117)
BUN/Creatinine Ratio: 14 (ref 9–23)
BUN: 15 mg/dL (ref 6–24)
Bilirubin Total: <0.2 mg/dL (ref 0.0–1.2)
CO2: 23 mmol/L (ref 20–29)
Calcium: 9.4 mg/dL (ref 8.7–10.2)
Chloride: 104 mmol/L (ref 96–106)
Creatinine, Ser: 1.1 mg/dL — ABNORMAL HIGH (ref 0.57–1.00)
GFR calc Af Amer: 68 mL/min/{1.73_m2} (ref >59)
GFR calc non Af Amer: 59 mL/min/{1.73_m2} — ABNORMAL LOW (ref >59)
Globulin, Total: 2.1 g/dL (ref 1.5–4.5)
Glucose: 96 mg/dL (ref 65–99)
Potassium: 4.5 mmol/L (ref 3.5–5.2)
Sodium: 140 mmol/L (ref 134–144)
Total Protein: 6.5 g/dL (ref 6.0–8.5)

## 2019-11-10 LAB — VITAMIN D 25 HYDROXY (VIT D DEFICIENCY, FRACTURES): Vit D, 25-Hydroxy: 25 ng/mL — ABNORMAL LOW (ref 30.0–100.0)

## 2019-11-10 LAB — HEMOGLOBIN A1C
Est. average glucose Bld gHb Est-mCnc: 105 mg/dL
Hgb A1c MFr Bld: 5.3 % (ref 4.8–5.6)

## 2019-11-10 LAB — LIPID PANEL
Chol/HDL Ratio: 2.7 ratio (ref 0.0–4.4)
Cholesterol, Total: 198 mg/dL (ref 100–199)
HDL: 73 mg/dL (ref >39)
LDL Chol Calc (NIH): 92 mg/dL (ref 0–99)
Triglycerides: 196 mg/dL — ABNORMAL HIGH (ref 0–149)
VLDL Cholesterol Cal: 33 mg/dL (ref 5–40)

## 2019-11-12 ENCOUNTER — Encounter: Payer: Self-pay | Admitting: Gastroenterology

## 2019-11-16 ENCOUNTER — Encounter: Payer: Self-pay | Admitting: Internal Medicine

## 2019-11-16 DIAGNOSIS — N289 Disorder of kidney and ureter, unspecified: Secondary | ICD-10-CM

## 2019-11-16 DIAGNOSIS — E559 Vitamin D deficiency, unspecified: Secondary | ICD-10-CM

## 2019-11-16 MED ORDER — VITAMIN D (ERGOCALCIFEROL) 1.25 MG (50000 UNIT) PO CAPS
50000.0000 [IU] | ORAL_CAPSULE | ORAL | 0 refills | Status: DC
Start: 2019-11-16 — End: 2020-04-08

## 2019-11-21 ENCOUNTER — Other Ambulatory Visit: Payer: Self-pay | Admitting: Internal Medicine

## 2019-11-21 NOTE — Telephone Encounter (Signed)
Last filled on 10/15/19 #30 with 0 refill  LOV 11/09/19 CPE

## 2019-11-23 ENCOUNTER — Other Ambulatory Visit: Payer: Self-pay

## 2019-11-23 ENCOUNTER — Telehealth: Payer: Self-pay

## 2019-11-23 ENCOUNTER — Encounter: Payer: Self-pay | Admitting: Gastroenterology

## 2019-11-23 ENCOUNTER — Ambulatory Visit: Payer: Managed Care, Other (non HMO) | Admitting: Gastroenterology

## 2019-11-23 VITALS — BP 118/72 | HR 75 | Temp 98.7°F | Ht 66.0 in | Wt 230.0 lb

## 2019-11-23 DIAGNOSIS — R1310 Dysphagia, unspecified: Secondary | ICD-10-CM

## 2019-11-23 DIAGNOSIS — Z1211 Encounter for screening for malignant neoplasm of colon: Secondary | ICD-10-CM | POA: Diagnosis not present

## 2019-11-23 DIAGNOSIS — Z7901 Long term (current) use of anticoagulants: Secondary | ICD-10-CM | POA: Diagnosis not present

## 2019-11-23 DIAGNOSIS — K219 Gastro-esophageal reflux disease without esophagitis: Secondary | ICD-10-CM | POA: Diagnosis not present

## 2019-11-23 MED ORDER — SUTAB 1479-225-188 MG PO TABS: 1.0000 | ORAL_TABLET | ORAL | 0 refills | Status: DC

## 2019-11-23 NOTE — Patient Instructions (Addendum)
If you are age 51 or older, your body mass index should be between 23-30. Your Body mass index is 37.12 kg/m. If this is out of the aforementioned range listed, please consider follow up with your Primary Care Provider.  If you are age 32 or younger, your body mass index should be between 19-25. Your Body mass index is 37.12 kg/m. If this is out of the aformentioned range listed, please consider follow up with your Primary Care Provider.   You have been scheduled for an endoscopy and colonoscopy. Please follow the written instructions given to you at your visit today. Please pick up your prep supplies at the pharmacy within the next 1-3 days. If you use inhalers (even only as needed), please bring them with you on the day of your procedure. Your physician has requested that you go to www.startemmi.com and enter the access code given to you at your visit today. This web site gives a general overview about your procedure. However, you should still follow specific instructions given to you by our office regarding your preparation for the procedure.  We have sent the following medications to your pharmacy for you to pick up at your convenience: Nila Nephew  You will be contacted by our office prior to your procedure for directions on holding your Eliquis .  If you do not hear from our office 1 week prior to your scheduled procedure, please call [phone] to discuss.    Thank you for choosing me and [facility].   Alonza Bogus, PA-C

## 2019-11-23 NOTE — Progress Notes (Signed)
Reviewed and agree with management plans. ? ?Dreyah Montrose L. Vasili Fok, MD, MPH  ?

## 2019-11-23 NOTE — Telephone Encounter (Signed)
[facility] [address] [place], [place]  [address] Phone:  [phone]   Fax:  [phone]   11/23/2019   RE:      Monica Floyd DOB:   1971-05-13 MRN:   [id number]   Dear Dr. Tasia Catchings,    We have scheduled the above patient for an endoscopic procedure. Our records show that she is on anticoagulation therapy.   Please advise as to whether the patient may come off her therapy of Eliquis 2 days prior to the endo/colon procedure, which is scheduled for 01/02/20.  Please fax back/ or route to [place] at [phone].   Sincerely,    Thurmon Fair , RMA

## 2019-11-23 NOTE — Progress Notes (Signed)
11/23/2019 Monica Floyd [id number] 1970/10/12   HISTORY OF PRESENT ILLNESS:  This is a pleasant 49 year old female who is new to our office.  Has been referred here by Webb Silversmith, NP, in order to discuss colonoscopy as well as acid reflux type symptoms.  She has never had a colonoscopy in the past.  Says that she moves her bowels regularly, no bleeding.   She reports that she's had acid reflux issues forever and had been on omeprazole 20 mg daily for years.  Says that over the past year and mores so even over the past few months symptoms have worsened.  Just increased omeprazole to 40 mg daily 2-3 weeks ago.  She describes intermittent issues with swallowing both solids and liquids.  Sometimes solid foods get stuck and then she has to vomit to relieve it.  She was diagnosed with DVT in February and then PE in March after having Covid in December.  Is on Eliquis now for a total of 6-9 months.  Saw hematology and extensive evaluation for clotting disorders was negative.   Past Medical History:  Diagnosis Date  . DVT (deep venous thrombosis) ([place])   . GERD (gastroesophageal reflux disease)   . Seasonal allergies    Past Surgical History:  Procedure Laterality Date  . KNEE ARTHROSCOPY W/ ACL RECONSTRUCTION Right 2012    reports that she has never smoked. She has never used smokeless tobacco. She reports current alcohol use. She reports that she does not use drugs. family history includes Alzheimer's disease in her maternal grandmother; Breast cancer in her mother; Breast cancer (age of onset: 72) in her maternal aunt and maternal grandmother; COPD in her father; Cancer in her father; Pernicious anemia in her mother. No Known Allergies    Outpatient Encounter Medications as of 11/23/2019  Medication Sig  . cetirizine (ZYRTEC) 10 MG tablet Take 10 mg by mouth daily.  Marland Kitchen ELIQUIS 5 MG TABS tablet Take 5 mg by mouth 2 (two) times daily.  . hydrOXYzine (ATARAX/VISTARIL) 10 MG tablet TAKE  1 TABLET BY MOUTH EVERY DAY AS NEEDED  . Multiple Vitamins-Minerals (MULTIPLE VITAMINS/WOMENS) tablet Take 1 tablet by mouth daily.  Marland Kitchen omeprazole (PRILOSEC) 40 MG capsule Take 1 capsule (40 mg total) by mouth daily.  . Vitamin D, Ergocalciferol, (DRISDOL) 1.25 MG (50000 UNIT) CAPS capsule Take 1 capsule (50,000 Units total) by mouth every 7 (seven) days.  . [DISCONTINUED] hydrOXYzine (ATARAX/VISTARIL) 10 MG tablet Take 1 tablet (10 mg total) by mouth daily as needed.   No facility-administered encounter medications on file as of 11/23/2019.    REVIEW OF SYSTEMS  : All other systems reviewed and negative except where noted in the History of Present Illness.  PHYSICAL EXAM: BP 118/72   Pulse 75   Temp 98.7 F (37.1 C)   Ht 5\' 6"  (1.676 m)   Wt 230 lb (104.3 kg)   LMP 11/04/2019   BMI 37.12 kg/m  General: Well developed white female in no acute distress Head: Normocephalic and atraumatic Eyes:  Sclerae anicteric, conjunctiva pink. Ears: Normal auditory acuity Lungs: Clear throughout to auscultation; no increased WOB. Heart: Regular rate and rhythm; no M/R/G. Abdomen: Soft, non-distended.  BS present.  Mild epigastric TTP. Rectal:  Will be done at the time of colonoscopy. Musculoskeletal: Symmetrical with no gross deformities  Skin: No lesions on visible extremities Extremities: No edema  Neurological: Alert oriented x 4, grossly non-focal Psychological:  Alert and cooperative. Normal mood and affect  ASSESSMENT AND PLAN: *GERD and dysphagia:  GERD somewhat improved with increase in PPI dose, but still with dysphagia intermittently.  Will plan for EGD with possible dilation with Dr. Tarri Glenn. *Screening for CRC:  Will plan for colonoscopy with Dr. Tarri Glenn. *Chronic anticoagulation with Eliquis due to DVT and PE in February and March of this year:  Will hold Eliquis for 24 hours prior to endoscopic procedures - will instruct when and how to resume after procedure. Benefits and risks of  procedure explained including risks of bleeding, perforation, infection, missed lesions, reactions to medications and possible need for hospitalization and surgery for complications. Additional rare but real risk of stroke or other vascular clotting events off of Eliquis also explained and need to seek urgent help if any signs of these problems occur. Will communicate by phone or EMR with patient's prescribing provider, Dr. Tasia Catchings, to confirm that holding Eliquis is reasonable in this case.   CC:  Jearld Fenton, NP

## 2019-11-26 ENCOUNTER — Other Ambulatory Visit: Payer: Self-pay

## 2019-11-26 ENCOUNTER — Ambulatory Visit
Admission: RE | Admit: 2019-11-26 | Discharge: 2019-11-26 | Disposition: A | Discharge status: Home / Self Care | Payer: Managed Care, Other (non HMO) | Source: Ambulatory Visit | Attending: Obstetrics & Gynecology | Admitting: Obstetrics & Gynecology

## 2019-11-26 DIAGNOSIS — Z1231 Encounter for screening mammogram for malignant neoplasm of breast: Secondary | ICD-10-CM

## 2019-11-26 NOTE — Progress Notes (Signed)
GYNECOLOGY ANNUAL PREVENTATIVE CARE ENCOUNTER NOTE  History:     Monica Floyd is a 49 y.o. G37P2002 female here for a routine annual gynecologic exam.  Current complaints: irregular bleeding for 8-10 months. Sometimes heavy, sometimes lighter but bleeding many days in a month. Associated with pelvic pressure and cramping. Of note, patient was diagnosed with a PE this year and has been on long tem anticoagulation, currently on Eliquis.   Furthermore, she had menorrhagia in 2018, ultrasound then showed endometrial stripe of 9 mm,  1.7 cm submucosal fibroid and 2.2 cm subserosal fibroid.  Endometrial biopsy showed benign endometrial polyp and proliferative endometrium.  Patient is very concerned and wants repeat evaluation.  Recent hemoglobin on 10/23/2019 was 12.6.  No symptoms of anemia.  Denies current vaginal bleeding, discharge, pelvic pain, problems with intercourse or other gynecologic concerns.    Gynecologic History Patient's last menstrual period was 11/10/2019 (exact date). Contraception: vasectomy Last Pap: 03/24/2017. Results were: normal with negative HPV Last mammogram: 11/26/19. Results were: normal  Obstetric History OB History  Gravida Para Term Preterm AB Living  2 2 2     2   SAB TAB Ectopic Multiple Live Births          2    # Outcome Date GA Lbr Len/2nd Weight Sex Delivery Anes PTL Lv  2 Term      Vag-Spont   LIV  1 Term      Vag-Spont   LIV    Past Medical History:  Diagnosis Date  . DVT (deep venous thrombosis) ([place])   . GERD (gastroesophageal reflux disease)   . Pulmonary embolism ([place]) 2021  . Seasonal allergies     Past Surgical History:  Procedure Laterality Date  . KNEE ARTHROSCOPY W/ ACL RECONSTRUCTION Right 2012    Current Outpatient Medications on File Prior to Visit  Medication Sig Dispense Refill  . cetirizine (ZYRTEC) 10 MG tablet Take 10 mg by mouth daily.    Marland Kitchen ELIQUIS 5 MG TABS tablet Take 5 mg by mouth 2 (two) times daily.    . hydrOXYzine  (ATARAX/VISTARIL) 10 MG tablet TAKE 1 TABLET BY MOUTH EVERY DAY AS NEEDED 30 tablet 0  . Multiple Vitamins-Minerals (MULTIPLE VITAMINS/WOMENS) tablet Take 1 tablet by mouth daily.    Marland Kitchen omeprazole (PRILOSEC) 40 MG capsule Take 1 capsule (40 mg total) by mouth daily. 60 capsule 2  . Vitamin D, Ergocalciferol, (DRISDOL) 1.25 MG (50000 UNIT) CAPS capsule Take 1 capsule (50,000 Units total) by mouth every 7 (seven) days. 12 capsule 0   No current facility-administered medications on file prior to visit.    No Known Allergies  Social History:  reports that she has never smoked. She has never used smokeless tobacco. She reports current alcohol use. She reports that she does not use drugs.  Family History  Problem Relation Age of Onset  . Breast cancer Mother   . Pernicious anemia Mother   . Cancer Father        prostate/bladder  . COPD Father   . Breast cancer Maternal Grandmother 34  . Alzheimer's disease Maternal Grandmother   . Breast cancer Maternal Aunt 74  . Colon cancer Neg Hx   . Rectal cancer Neg Hx   . Esophageal cancer Neg Hx   . Stomach cancer Neg Hx     The following portions of the patient's history were reviewed and updated as appropriate: allergies, current medications, past family history, past medical history, past social history, past  surgical history and problem list.  Review of Systems Pertinent items noted in HPI and remainder of comprehensive ROS otherwise negative.  Physical Exam:  BP 121/85   Pulse 86   Ht 5\' 6"  (1.676 m)   Wt 231 lb (104.8 kg)   LMP 11/10/2019 (Exact Date)   BMI 37.28 kg/m  CONSTITUTIONAL: Well-developed, well-nourished female in no acute distress.  HENT:  Normocephalic, atraumatic, External right and left ear normal. Oropharynx is clear and moist EYES: Conjunctivae and EOM are normal. Pupils are equal, round, and reactive to light. No scleral icterus.  NECK: Normal range of motion, supple, no masses.  Normal thyroid.  SKIN: Skin is  warm and dry. No rash noted. Not diaphoretic. No erythema. No pallor. MUSCULOSKELETAL: Normal range of motion. No tenderness.  No cyanosis, clubbing, or edema.  2+ distal pulses. NEUROLOGIC: Alert and oriented to person, place, and time. Normal reflexes, muscle tone coordination.  PSYCHIATRIC: Normal mood and affect. Normal behavior. Normal judgment and thought content. CARDIOVASCULAR: Normal heart rate noted, regular rhythm RESPIRATORY: Clear to auscultation bilaterally. Effort and breath sounds normal, no problems with respiration noted. BREASTS: Symmetric in size. No masses, tenderness, skin changes, nipple drainage, or lymphadenopathy bilaterally. Performed in the presence of a chaperone. ABDOMEN: Soft, no distention noted.  No tenderness, rebound or guarding.  PELVIC: Normal appearing external genitalia and urethral meatus; normal appearing vaginal mucosa and cervix.  No abnormal discharge noted.  Pap smear obtained.  Normal uterine size, no other palpable masses, no uterine or adnexal tenderness.  Performed in the presence of a chaperone.  ENDOMETRIAL BIOPSY     The indications for endometrial biopsy were reviewed.   Risks of the biopsy including cramping, bleeding, infection, uterine perforation, inadequate specimen and need for additional procedures  were discussed. The patient states she understands and agrees to undergo procedure today. Consent was signed. Time out was performed.  During the pelvic exam, the cervix was prepped with Betadine. A single-toothed tenaculum was placed on the anterior lip of the cervix to stabilize it. The 3 mm pipelle was introduced into the endometrial cavity without difficulty to a depth of 8 cm, and a moderate amount of tissue was obtained and sent to pathology. The instruments were removed from the patient's vagina. Minimal bleeding from the cervix was noted. The patient tolerated the procedure well. Routine post-procedure instructions were given to the patient.      Assessment and Plan:      1. Abnormal uterine bleeding (AUB) 2. Fibroids, submucosal 3. Pulmonary embolism during treatment with long-term anticoagulation therapy [facility]) Discussed long history of AUB but emphasized that this could be worsened by her anticoagulation.  However, she does have a history of submucosal fibroid and endometrial polyp. Will obtain ultrasound for evaluation of any changes since 2018; she may need hysteroscopic myomectomy and/or endometrial ablation for long term management. Will follow up results of pap smear, endometrial biopsy and ultrasound and manage accordingly. Megace prescribed for medical control of AUB for now, bleeding precautions reviewed.  - IGP, Aptima HPV, rfx 16/18,45 - Pathology ([facility]) - US PELVIC COMPLETE WITH TRANSVAGINAL; Future - megestrol (MEGACE) 40 MG tablet; Take 1 tablet (40 mg total) by mouth daily. Can increase to two tablets daily if needed to control bleeding  Dispense: 60 tablet; Refill: 5  4. Well woman exam with routine gynecological exam - IGP, Aptima HPV, rfx 16/18,45 Will follow up results of pap smear and manage accordingly. Routine preventative health maintenance measures emphasized. Please refer to After  Visit Summary for other counseling recommendations.      Verita Schneiders, MD, [place] [facility] [employer], [place]

## 2019-11-27 ENCOUNTER — Encounter: Payer: Self-pay | Admitting: Obstetrics & Gynecology

## 2019-11-27 ENCOUNTER — Ambulatory Visit (INDEPENDENT_AMBULATORY_CARE_PROVIDER_SITE_OTHER): Payer: Managed Care, Other (non HMO) | Admitting: Obstetrics & Gynecology

## 2019-11-27 ENCOUNTER — Other Ambulatory Visit: Payer: Self-pay

## 2019-11-27 ENCOUNTER — Other Ambulatory Visit: Payer: Self-pay | Admitting: Obstetrics & Gynecology

## 2019-11-27 ENCOUNTER — Ambulatory Visit
Admission: RE | Admit: 2019-11-27 | Discharge: 2019-11-27 | Disposition: A | Discharge status: Home / Self Care | Payer: Managed Care, Other (non HMO) | Source: Ambulatory Visit | Attending: Obstetrics & Gynecology | Admitting: Obstetrics & Gynecology

## 2019-11-27 VITALS — BP 121/85 | HR 86 | Ht 66.0 in | Wt 231.0 lb

## 2019-11-27 DIAGNOSIS — N939 Abnormal uterine and vaginal bleeding, unspecified: Secondary | ICD-10-CM

## 2019-11-27 DIAGNOSIS — Z01419 Encounter for gynecological examination (general) (routine) without abnormal findings: Secondary | ICD-10-CM | POA: Diagnosis not present

## 2019-11-27 DIAGNOSIS — Z86718 Personal history of other venous thrombosis and embolism: Secondary | ICD-10-CM | POA: Diagnosis not present

## 2019-11-27 DIAGNOSIS — D250 Submucous leiomyoma of uterus: Secondary | ICD-10-CM | POA: Diagnosis not present

## 2019-11-27 DIAGNOSIS — Z7901 Long term (current) use of anticoagulants: Secondary | ICD-10-CM

## 2019-11-27 DIAGNOSIS — I2699 Other pulmonary embolism without acute cor pulmonale: Secondary | ICD-10-CM

## 2019-11-27 MED ORDER — MEGESTROL ACETATE 40 MG PO TABS: 40.0000 mg | ORAL_TABLET | Freq: Every day | ORAL | 5 refills | Status: DC

## 2019-11-27 NOTE — Patient Instructions (Signed)
ENDOMETRIAL BIOPSY POST-PROCEDURE INSTRUCTIONS  1. You may take Tylenol for pain if needed.  Cramping should resolve within in 24 hours.  2. You may have a small amount of spotting.  You should wear a mini pad for the next few days.  3. You may have intercourse after 24 hours.  4. You need to call if you have any pelvic pain, fever, heavy bleeding or foul smelling vaginal discharge.  5. Shower or bathe as normal  6. We will call you within one week with results or we will discuss the results at your follow-up appointment if needed.     Preventive Care 75-51 Years Old, Female Preventive care refers to visits with your health care provider and lifestyle choices that can promote health and wellness. This includes:  A yearly physical exam. This may also be called an annual well check.  Regular dental visits and eye exams.  Immunizations.  Screening for certain conditions.  Healthy lifestyle choices, such as eating a healthy diet, getting regular exercise, not using drugs or products that contain nicotine and tobacco, and limiting alcohol use. What can I expect for my preventive care visit? Physical exam Your health care provider will check your:  Height and weight. This may be used to calculate body mass index (BMI), which tells if you are at a healthy weight.  Heart rate and blood pressure.  Skin for abnormal spots. Counseling Your health care provider may ask you questions about your:  Alcohol, tobacco, and drug use.  Emotional well-being.  Home and relationship well-being.  Sexual activity.  Eating habits.  Work and work Statistician.  Method of birth control.  Menstrual cycle.  Pregnancy history. What immunizations do I need?  Influenza (flu) vaccine  This is recommended every year. Tetanus, diphtheria, and pertussis (Tdap) vaccine  You may need a Td booster every 10 years. Varicella (chickenpox) vaccine  You may need this if you have not been  vaccinated. Zoster (shingles) vaccine  You may need this after age 51. Measles, mumps, and rubella (MMR) vaccine  You may need at least one dose of MMR if you were born in 1957 or later. You may also need a second dose. Pneumococcal conjugate (PCV13) vaccine  You may need this if you have certain conditions and were not previously vaccinated. Pneumococcal polysaccharide (PPSV23) vaccine  You may need one or two doses if you smoke cigarettes or if you have certain conditions. Meningococcal conjugate (MenACWY) vaccine  You may need this if you have certain conditions. Hepatitis A vaccine  You may need this if you have certain conditions or if you travel or work in places where you may be exposed to hepatitis A. Hepatitis B vaccine  You may need this if you have certain conditions or if you travel or work in places where you may be exposed to hepatitis B. Haemophilus influenzae type b (Hib) vaccine  You may need this if you have certain conditions. Human papillomavirus (HPV) vaccine  If recommended by your health care provider, you may need three doses over 6 months. You may receive vaccines as individual doses or as more than one vaccine together in one shot (combination vaccines). Talk with your health care provider about the risks and benefits of combination vaccines. What tests do I need? Blood tests  Lipid and cholesterol levels. These may be checked every 5 years, or more frequently if you are over 51 years old.  Hepatitis C test.  Hepatitis B test. Screening  Lung cancer screening.  You may have this screening every year starting at age 51 if you have a 30-pack-year history of smoking and currently smoke or have quit within the past 15 years.  Colorectal cancer screening. All adults should have this screening starting at age 40 and continuing until age 51. Your health care provider may recommend screening at age 51 if you are at increased risk. You will have tests every  1-10 years, depending on your results and the type of screening test.  Diabetes screening. This is done by checking your blood sugar (glucose) after you have not eaten for a while (fasting). You may have this done every 1-3 years.  Mammogram. This may be done every 1-2 years. Talk with your health care provider about when you should start having regular mammograms. This may depend on whether you have a family history of breast cancer.  BRCA-related cancer screening. This may be done if you have a family history of breast, ovarian, tubal, or peritoneal cancers.  Pelvic exam and Pap test. This may be done every 3 years starting at age 51. Starting at age 24, this may be done every 5 years if you have a Pap test in combination with an HPV test. Other tests  Sexually transmitted disease (STD) testing.  Bone density scan. This is done to screen for osteoporosis. You may have this scan if you are at high risk for osteoporosis. Follow these instructions at home: Eating and drinking  Eat a diet that includes fresh fruits and vegetables, whole grains, lean protein, and low-fat dairy.  Take vitamin and mineral supplements as recommended by your health care provider.  Do not drink alcohol if: ? Your health care provider tells you not to drink. ? You are pregnant, may be pregnant, or are planning to become pregnant.  If you drink alcohol: ? Limit how much you have to 0-1 drink a day. ? Be aware of how much alcohol is in your drink. In the [place] one drink equals one 12 oz bottle of beer (355 mL), one 5 oz glass of wine (148 mL), or one 1 oz glass of hard liquor (44 mL). Lifestyle  Take daily care of your teeth and gums.  Stay active. Exercise for at least 30 minutes on 5 or more days each week.  Do not use any products that contain nicotine or tobacco, such as cigarettes, e-cigarettes, and chewing tobacco. If you need help quitting, ask your health care provider.  If you are sexually active,  practice safe sex. Use a condom or other form of birth control (contraception) in order to prevent pregnancy and STIs (sexually transmitted infections).  If told by your health care provider, take low-dose aspirin daily starting at age 51. What's next?  Visit your health care provider once a year for a well check visit.  Ask your health care provider how often you should have your eyes and teeth checked.  Stay up to date on all vaccines. This information is not intended to replace advice given to you by your health care provider. Make sure you discuss any questions you have with your health care provider. Document Revised: 03/09/2018 Document Reviewed: 03/09/2018 Elsevier Patient Education  [employer].   .

## 2019-11-28 NOTE — Telephone Encounter (Signed)
Her thrombosis event is in January/February 2021, I recommend to hold off elective procedure and wait until she finished 6 months of anticoagulation. Thank you .

## 2019-11-29 LAB — ANATOMIC PATHOLOGY REPORT

## 2019-11-29 NOTE — Telephone Encounter (Signed)
Please make patient aware of their recommendation.  Can plan to be seen in July and schedule procedures for August.

## 2019-11-29 NOTE — Telephone Encounter (Signed)
Left message on voicemail to call me back regarding procedure clearance.

## 2019-12-03 LAB — IGP, APTIMA HPV, RFX 16/18,45: HPV Aptima: NEGATIVE

## 2019-12-03 LAB — GYN REPORT: PAP Smear Comment: 0

## 2019-12-03 NOTE — Telephone Encounter (Signed)
Patient aware of procedure being canceled.  She will reschedule for August.

## 2019-12-19 ENCOUNTER — Other Ambulatory Visit (INDEPENDENT_AMBULATORY_CARE_PROVIDER_SITE_OTHER): Payer: Managed Care, Other (non HMO)

## 2019-12-19 ENCOUNTER — Other Ambulatory Visit: Payer: Managed Care, Other (non HMO)

## 2019-12-19 DIAGNOSIS — E559 Vitamin D deficiency, unspecified: Secondary | ICD-10-CM | POA: Diagnosis not present

## 2019-12-19 DIAGNOSIS — N289 Disorder of kidney and ureter, unspecified: Secondary | ICD-10-CM

## 2019-12-19 NOTE — Addendum Note (Signed)
Addended by: Ellamae Sia on: 12/19/2019 07:44 AM   Modules accepted: Orders

## 2019-12-20 LAB — BASIC METABOLIC PANEL
BUN/Creatinine Ratio: 11 (ref 9–23)
BUN: 14 mg/dL (ref 6–24)
CO2: 20 mmol/L (ref 20–29)
Calcium: 9.7 mg/dL (ref 8.7–10.2)
Chloride: 104 mmol/L (ref 96–106)
Creatinine, Ser: 1.26 mg/dL — ABNORMAL HIGH (ref 0.57–1.00)
GFR calc Af Amer: 57 mL/min/{1.73_m2} — ABNORMAL LOW (ref >59)
GFR calc non Af Amer: 49 mL/min/{1.73_m2} — ABNORMAL LOW (ref >59)
Glucose: 77 mg/dL (ref 65–99)
Potassium: 4.5 mmol/L (ref 3.5–5.2)
Sodium: 139 mmol/L (ref 134–144)

## 2019-12-20 LAB — VITAMIN D 25 HYDROXY (VIT D DEFICIENCY, FRACTURES): Vit D, 25-Hydroxy: 40.4 ng/mL (ref 30.0–100.0)

## 2019-12-21 ENCOUNTER — Encounter: Payer: Self-pay | Admitting: Internal Medicine

## 2019-12-24 NOTE — Addendum Note (Signed)
Addended by: Jearld Fenton on: 12/24/2019 09:25 AM   Modules accepted: Orders

## 2019-12-25 ENCOUNTER — Ambulatory Visit: Payer: Managed Care, Other (non HMO) | Admitting: Obstetrics & Gynecology

## 2019-12-25 ENCOUNTER — Encounter: Payer: Self-pay | Admitting: Obstetrics & Gynecology

## 2019-12-25 ENCOUNTER — Other Ambulatory Visit: Payer: Self-pay

## 2019-12-25 VITALS — BP 119/81 | HR 82 | Wt 232.6 lb

## 2019-12-25 DIAGNOSIS — N939 Abnormal uterine and vaginal bleeding, unspecified: Secondary | ICD-10-CM | POA: Diagnosis not present

## 2019-12-25 DIAGNOSIS — I2699 Other pulmonary embolism without acute cor pulmonale: Secondary | ICD-10-CM

## 2019-12-25 DIAGNOSIS — Z7901 Long term (current) use of anticoagulants: Secondary | ICD-10-CM

## 2019-12-25 DIAGNOSIS — Z86711 Personal history of pulmonary embolism: Secondary | ICD-10-CM

## 2019-12-25 DIAGNOSIS — D252 Subserosal leiomyoma of uterus: Secondary | ICD-10-CM

## 2019-12-25 NOTE — Patient Instructions (Signed)
Return to clinic for any scheduled appointments or for any gynecologic concerns as needed.   

## 2019-12-25 NOTE — Progress Notes (Signed)
GYNECOLOGY OFFICE VISIT NOTE  History:   Monica Floyd is a 49 y.o. [phone] here today for follow up after ultrasound done for AUB in the setting of chronic anticoagulation for recent PE. No bleeding noted since inception of Megace, but feels she has gained weight.  Had some pain during expected time for her period, but no bleeding.  She denies any current abnormal vaginal discharge, bleeding, pelvic pain or other concerns.    Past Medical History:  Diagnosis Date  . DVT (deep venous thrombosis) ([place])   . GERD (gastroesophageal reflux disease)   . Pulmonary embolism ([place]) 2021  . Seasonal allergies     Past Surgical History:  Procedure Laterality Date  . KNEE ARTHROSCOPY W/ ACL RECONSTRUCTION Right 2012    The following portions of the patient's history were reviewed and updated as appropriate: allergies, current medications, past family history, past medical history, past social history, past surgical history and problem list.   Health Maintenance:  Normal pap and negative HRHPV on 11/27/2019.  Normal mammogram on 11/26/2019.   Review of Systems:  Pertinent items noted in HPI and remainder of comprehensive ROS otherwise negative.  Physical Exam:  BP 119/81   Pulse 82   Wt 232 lb 9.6 oz (105.5 kg)   LMP 11/11/2019   BMI 37.54 kg/m  CONSTITUTIONAL: Well-developed, well-nourished female in no acute distress.  HEENT:  Normocephalic, atraumatic. External right and left ear normal. No scleral icterus.  NECK: Normal range of motion, supple, no masses noted on observation SKIN: No rash noted. Not diaphoretic. No erythema. No pallor. MUSCULOSKELETAL: Normal range of motion. No edema noted. NEUROLOGIC: Alert and oriented to person, place, and time. Normal muscle tone coordination. No cranial nerve deficit noted. PSYCHIATRIC: Normal mood and affect. Normal behavior. Normal judgment and thought content. CARDIOVASCULAR: Normal heart rate noted RESPIRATORY: Effort and breath sounds  normal, no problems with respiration noted ABDOMEN: No overt masses noted. No other overt distention noted.   PELVIC: Deferred  Labs and Imaging CBC Latest Ref Rng & Units 11/09/2019 10/25/2019 08/24/2018  WBC 3.4 - 10.8 x10E3/uL 8.3 7.7 8.0  Hemoglobin 11.1 - 15.9 g/dL 12.6 15.1 15.1  Hematocrit 34.0 - 46.6 % 36.5 43.0 43.4  Platelets 150 - 450 x10E3/uL 293 289 322    MM 3D SCREEN BREAST BILATERAL  Result Date: 11/26/2019 CLINICAL DATA:  Screening. EXAM: DIGITAL SCREENING BILATERAL MAMMOGRAM WITH TOMO AND CAD COMPARISON:  Previous exam(s). ACR Breast Density Category b: There are scattered areas of fibroglandular density. FINDINGS: There are no findings suspicious for malignancy. Images were processed with CAD. IMPRESSION: No mammographic evidence of malignancy. A result letter of this screening mammogram will be mailed directly to the patient. RECOMMENDATION: Screening mammogram in one year. (Code:SM-B-01Y) BI-RADS CATEGORY  1: Negative. Electronically Signed   By: [name] M.D.   On: 11/26/2019 13:12   US PELVIC COMPLETE WITH TRANSVAGINAL  Result Date: 11/27/2019 CLINICAL DATA:  Abnormal uterine bleeding, LMP 11/10/2019 EXAM: TRANSABDOMINAL AND TRANSVAGINAL ULTRASOUND OF PELVIS TECHNIQUE: Both transabdominal and transvaginal ultrasound examinations of the pelvis were performed. Transabdominal technique was performed for global imaging of the pelvis including uterus, ovaries, adnexal regions, and pelvic cul-de-sac. It was necessary to proceed with endovaginal exam following the transabdominal exam to visualize the uterus, endometrium, and ovaries. COMPARISON:  03/29/2017 FINDINGS: Uterus Measurements: 8.8 x 5.4 x 5.5 cm = volume: 137 mL. Heterogeneous myometrium. Anteverted. Small subserosal leiomyoma at posterior upper uterus 1.9 x 1.0 x 1.8 cm. Additional small exophytic  leiomyoma at anterior upper uterus 1.2 x 1.1 x 1.8 cm. Nabothian cyst at cervix. Endometrium Thickness: 5 mm.  No endometrial  fluid or focal abnormality Right ovary Measurements: 2.7 x 1.8 x 1.7 cm = volume: 4.3 mL. Normal morphology without mass Left ovary Measurements: 2.6 x 1.3 x 1.1 cm = volume: 2.0 mL. Normal morphology without mass Other findings No free pelvic fluid.  No adnexal masses. IMPRESSION: Two small uterine leiomyomata as above. Otherwise normal exam. Electronically Signed   By: [name] M.D.   On: 11/27/2019 16:34      Assessment and Plan:      1. Abnormal uterine bleeding (AUB) 2. Fibroids, subserous 3. Pulmonary embolism during treatment with long-term anticoagulation therapy ([place]) Discussed ultrasound findings, two small subserosal fibroids. Bleeding controlled on Megace, no further intervention needed for now. Advised to keep food diary to offset the appetite stimulant effect of the Megace, and continue exercise. Discussed alternative treatments: progestin IUD vs endometrial ablation vs hysterectomy but recommended she continue Megace for now at least until the end of her anticoagulation.  Bleeding precautions reviewed.   Return for any gynecologic concerns.    Total face-to-face time with patient: 15 minutes.  Over 50% of encounter was spent on counseling and coordination of care.   Verita Schneiders, MD, [place] for [employer], [place]

## 2020-01-02 ENCOUNTER — Encounter: Payer: Managed Care, Other (non HMO) | Admitting: Gastroenterology

## 2020-01-29 ENCOUNTER — Encounter: Payer: Self-pay | Admitting: Oncology

## 2020-01-31 ENCOUNTER — Other Ambulatory Visit: Payer: Self-pay

## 2020-01-31 ENCOUNTER — Inpatient Hospital Stay: Payer: Managed Care, Other (non HMO) | Attending: Oncology | Admitting: Oncology

## 2020-01-31 ENCOUNTER — Encounter: Payer: Self-pay | Admitting: Oncology

## 2020-01-31 VITALS — BP 117/83 | HR 85 | Temp 97.6°F | Resp 18 | Wt 232.7 lb

## 2020-01-31 DIAGNOSIS — Z7901 Long term (current) use of anticoagulants: Secondary | ICD-10-CM | POA: Insufficient documentation

## 2020-01-31 DIAGNOSIS — Z86711 Personal history of pulmonary embolism: Secondary | ICD-10-CM | POA: Insufficient documentation

## 2020-01-31 DIAGNOSIS — Z79899 Other long term (current) drug therapy: Secondary | ICD-10-CM | POA: Insufficient documentation

## 2020-01-31 DIAGNOSIS — Z86718 Personal history of other venous thrombosis and embolism: Secondary | ICD-10-CM | POA: Diagnosis not present

## 2020-01-31 NOTE — Progress Notes (Signed)
Hematology/Oncology Consult note [facility] Telephone:([phone] [phone]   Patient Care Team: Jearld Fenton, NP as PCP - General (Internal Medicine)  REFERRING PROVIDER: Jearld Fenton, NP  CHIEF COMPLAINTS/REASON FOR VISIT:  Evaluation of acute DVT and PE  HISTORY OF PRESENTING ILLNESS:   Monica Floyd is a  49 y.o.  female with PMH listed below was seen in consultation at the request of  Jearld Fenton, NP  for evaluation of acute DVT and PE Went to see vascular surgeon on 09/03/2019 for evaluation of significant right lower extremity discomfort.  She had Covid 19 infection in December 2001.  Prior to the onset of symptoms, had long distance driving to and from [place].  She did take 2 breaks during the 7-hour 1 with driving.  She had no chest pain or shortness of breath at that time.  Patient had duplex ultrasound study done at vascular surgery's office which showed decreased compressibility of the gastrocnemius veins and posterior tibial veins of the right calf.  Popliteal vein, tibial veins and femoral vein also had full compressibility. Patient was diagnosed with subacute DVT involving right lower extremity and was recommended to start Eliquis 10 mg twice daily for a week followed by Eliquis 5 mg twice daily.  Patient reports that her symptoms improved after started on anticoagulation. However when she went to [place] in March 2021 she had experienced progressively worsening chest pain and she presented to a local hospital in [place].  Patient recalls that when she was diagnosed with DVT, she may have had some chest discomfort however due to the severe right lower extremity tenderness and discomfort, she was concentrated on that and ignored her chest symptoms.  Patient had CT angio chest pulmonary embolism protocol done on 09/26/2019 which showed small amount of pulmonary emboli within segmental and subsegmental pulmonary arteries predominantly in  the right lower lobe. Patient was continued on Eliquis as physician in [place] felt that patient may have had pulmonary embolism around the same time of diagnosis of DVT.  CT angiogram was not done initially.  This was not considered to be on Eliquis failure.  Patient came back to [place] to follow-up with primary care provider and was referred to hematology for further evaluation and discussion.  Today patient reports that her right lower extremity tenderness has significantly improved.  She still has some mild shortness of breath with exertion.  No chest pain. Denies any previous history of blood clots.  Father have a history of provoked DVT.  INTERVAL HISTORY Monica Floyd is a 49 y.o. female who has above history reviewed by me today presents for follow up visit for management of PE and DVT Problems and complaints are listed below: She was seen by vascular surgery Dr.Featherson who did another [place] right lower extremity in June. Per his note, there is residual right lower extremity DVT, unclear if acute or chronic.  Patient reports her breathing has improved. She has been on Eliquis 5mg  BID for about 6 months now.   Review of Systems  Constitutional: Negative for appetite change, chills, fatigue and fever.  HENT:   Negative for hearing loss and voice change.   Eyes: Negative for eye problems.  Respiratory: Negative for chest tightness and cough.   Cardiovascular: Negative for chest pain.  Gastrointestinal: Negative for abdominal distention, abdominal pain and blood in stool.  Endocrine: Negative for hot flashes.  Genitourinary: Negative for difficulty urinating and frequency.   Musculoskeletal: Negative for arthralgias.  Skin: Negative for itching and rash.  Neurological: Negative for extremity weakness.  Hematological: Negative for adenopathy.  Psychiatric/Behavioral: Negative for confusion.    MEDICAL HISTORY:  Past Medical History:  Diagnosis Date  . DVT (deep venous  thrombosis) ([place])   . GERD (gastroesophageal reflux disease)   . Pulmonary embolism ([place]) 2021  . Seasonal allergies     SURGICAL HISTORY: Past Surgical History:  Procedure Laterality Date  . KNEE ARTHROSCOPY W/ ACL RECONSTRUCTION Right 2012    SOCIAL HISTORY: Social History   Socioeconomic History  . Marital status: Married    Spouse name: Not on file  . Number of children: Not on file  . Years of education: Not on file  . Highest education level: Not on file  Occupational History  . Not on file  Tobacco Use  . Smoking status: Never Smoker  . Smokeless tobacco: Never Used  Vaping Use  . Vaping Use: Never used  Substance and Sexual Activity  . Alcohol use: Yes    Alcohol/week: 0.0 standard drinks    Comment: occasional  . Drug use: No  . Sexual activity: Yes    Partners: Male    Birth control/protection: Surgical    Comment: husband-vasectomy  Other Topics Concern  . Not on file  Social History Narrative  . Not on file   Social Determinants of Health   Financial Resource Strain:   . Difficulty of Paying Living Expenses:   Food Insecurity:   . Worried About Charity fundraiser in the Last Year:   . Arboriculturist in the Last Year:   Transportation Needs:   . Film/video editor (Medical):   Marland Kitchen Lack of Transportation (Non-Medical):   Physical Activity:   . Days of Exercise per Week:   . Minutes of Exercise per Session:   Stress:   . Feeling of Stress :   Social Connections:   . Frequency of Communication with Friends and Family:   . Frequency of Social Gatherings with Friends and Family:   . Attends Religious Services:   . Active Member of Clubs or Organizations:   . Attends Archivist Meetings:   Marland Kitchen Marital Status:   Intimate Partner Violence:   . Fear of Current or Ex-Partner:   . Emotionally Abused:   Marland Kitchen Physically Abused:   . Sexually Abused:     FAMILY HISTORY: Family History  Problem Relation Age of Onset  . Breast cancer Mother    . Pernicious anemia Mother   . Cancer Father        prostate/bladder  . COPD Father   . Breast cancer Maternal Grandmother 79  . Alzheimer's disease Maternal Grandmother   . Breast cancer Maternal Aunt 26  . Colon cancer Neg Hx   . Rectal cancer Neg Hx   . Esophageal cancer Neg Hx   . Stomach cancer Neg Hx     ALLERGIES:  has No Known Allergies.  MEDICATIONS:  Current Outpatient Medications  Medication Sig Dispense Refill  . cetirizine (ZYRTEC) 10 MG tablet Take 10 mg by mouth daily.    Marland Kitchen ELIQUIS 5 MG TABS tablet Take 5 mg by mouth 2 (two) times daily.    . hydrOXYzine (ATARAX/VISTARIL) 10 MG tablet TAKE 1 TABLET BY MOUTH EVERY DAY AS NEEDED 30 tablet 0  . megestrol (MEGACE) 40 MG tablet Take 1 tablet (40 mg total) by mouth daily. Can increase to two tablets daily if needed to control bleeding 60 tablet 5  .  omeprazole (PRILOSEC) 40 MG capsule Take 1 capsule (40 mg total) by mouth daily. 60 capsule 2  . Vitamin D, Ergocalciferol, (DRISDOL) 1.25 MG (50000 UNIT) CAPS capsule Take 1 capsule (50,000 Units total) by mouth every 7 (seven) days. 12 capsule 0   No current facility-administered medications for this visit.     PHYSICAL EXAMINATION: ECOG PERFORMANCE STATUS: 0 - Asymptomatic Vitals:   01/31/20 1039  BP: 117/83  Pulse: 85  Resp: 18  Temp: 97.6 F (36.4 C)   Filed Weights   01/31/20 1039  Weight: (!) 232 lb 11.2 oz (105.6 kg)    Physical Exam Constitutional:      General: She is not in acute distress. HENT:     Head: Normocephalic and atraumatic.  Eyes:     General: No scleral icterus. Cardiovascular:     Rate and Rhythm: Normal rate and regular rhythm.     Heart sounds: Normal heart sounds.  Pulmonary:     Effort: Pulmonary effort is normal. No respiratory distress.     Breath sounds: No wheezing.  Abdominal:     General: Bowel sounds are normal. There is no distension.     Palpations: Abdomen is soft.  Musculoskeletal:        General: No deformity.  Normal range of motion.     Cervical back: Normal range of motion and neck supple.     Comments: Trace right lower extremity edema  Skin:    General: Skin is warm and dry.     Findings: No erythema or rash.  Neurological:     Mental Status: She is alert and oriented to person, place, and time. Mental status is at baseline.     Cranial Nerves: No cranial nerve deficit.     Coordination: Coordination normal.  Psychiatric:        Mood and Affect: Mood normal.     LABORATORY DATA:  I have reviewed the data as listed Lab Results  Component Value Date   WBC 8.3 11/09/2019   HGB 12.6 11/09/2019   HCT 36.5 11/09/2019   MCV 89 11/09/2019   PLT 293 11/09/2019   Recent Labs    10/25/19 0845 11/02/19 1045 11/09/19 1456 12/19/19 0746  NA 138  --  140 139  K 4.6  --  4.5 4.5  CL 100  --  104 104  CO2 23  --  23 20  GLUCOSE 87  --  96 77  BUN 15  --  15 14  CREATININE 1.15*  --  1.10* 1.26*  CALCIUM 9.5  --  9.4 9.7  GFRNONAA 56*  --  59* 49*  GFRAA 64  --  68 57*  PROT  --  7.4 6.5  --   ALBUMIN  --  4.1 4.4  --   AST  --  14* 17  --   ALT  --  16 13  --   ALKPHOS  --  40 60  --   BILITOT  --  0.7 <0.2  --   BILIDIR  --  <0.1  --   --   IBILI  --  NOT CALCULATED  --   --    Iron/TIBC/Ferritin/ %Sat No results found for: IRON, TIBC, FERRITIN, IRONPCTSAT    RADIOGRAPHIC STUDIES: I have personally reviewed the radiological images as listed and agreed with the findings in the report. No results found.    ASSESSMENT & PLAN:  1. History of pulmonary embolism   2. History of deep  vein thrombosis (DVT) of lower extremity    History of pulmonary embolism and DVT, provoked events.  She has finished 6 months of anticoagulation.   hypercoagulable work-up including factor V Leiden mutation, prothrombin gene mutation, antiphospholipid syndrome profile, protein C&S are all negative.  Office will call Dr.Featherson's office and ask him to call me back to clarify her repeat US  result. That [place] was done 5 months after DVT event.    All questions were answered. The patient knows to call the clinic with any problems questions or concerns.  Addendum:  02/01/2020, I discussed with Dr. Aleda Grana over the phone.  Confirmed with him that the repeat ultrasound that was done in his office in June 2021 showed chronic lower extremity DVT. We will advised patient to stop anticoagulation with Eliquis.  Recommend aspirin 81 mg for prophylaxis.  Patient will be discharged to follow-up with primary care provider.  Also reviewed patient's medication list. She is on. Megace. I recommend patient to further discuss with Gyn about the potential risk of increased thrombosis risk of megace.   Earlie Server, MD, PhD Hematology Oncology [facility] Pager- [phone] 01/31/2020

## 2020-01-31 NOTE — Progress Notes (Signed)
Patient here for follow up. No new concerns voiced.  °

## 2020-02-04 ENCOUNTER — Telehealth: Payer: Self-pay

## 2020-02-04 NOTE — Telephone Encounter (Signed)
-----   Message from Earlie Server, MD sent at 02/01/2020 10:49 PM EDT ----- Please called patient and let her know that I have discussed with Dr.Featherson and he confirmed that there is no acute DVT on repeat US in his office. She does have chronic DVT which does not need blood thinner. I recommend her to stop Eliquis and switch to Asprin.  Also I noticed that she is on megace. I recommend her to discuss with gyn about the potential increased risk of thrombosis associated with megace. If she has to use megace, then I will recommend her to stay on Elqiuis and continue followup with me in 6 months.

## 2020-02-04 NOTE — Telephone Encounter (Signed)
Pt has been notified. Advised her to discuss Megace with GYN and give [place] a call to schedule 6 month follow up if needed, depending on determination of megace use.

## 2020-02-11 ENCOUNTER — Telehealth: Payer: Self-pay | Admitting: Internal Medicine

## 2020-02-11 DIAGNOSIS — E559 Vitamin D deficiency, unspecified: Secondary | ICD-10-CM

## 2020-02-11 NOTE — Telephone Encounter (Signed)
-----   Message from Cloyd Stagers, RT sent at 02/11/2020  2:03 PM EDT ----- Regarding: Lab Orders for Monday 8.16.2021 Please place lab orders for Monday 8.16.2021, appt notes state "3 month check Vit D" Thank you, Dyke Maes RT(R)

## 2020-02-19 ENCOUNTER — Encounter: Payer: Self-pay | Admitting: Obstetrics & Gynecology

## 2020-02-19 ENCOUNTER — Other Ambulatory Visit: Payer: Self-pay

## 2020-02-19 ENCOUNTER — Ambulatory Visit (INDEPENDENT_AMBULATORY_CARE_PROVIDER_SITE_OTHER): Payer: Managed Care, Other (non HMO) | Admitting: Obstetrics & Gynecology

## 2020-02-19 VITALS — BP 133/88 | HR 72 | Wt 234.0 lb

## 2020-02-19 DIAGNOSIS — Z7901 Long term (current) use of anticoagulants: Secondary | ICD-10-CM

## 2020-02-19 DIAGNOSIS — N939 Abnormal uterine and vaginal bleeding, unspecified: Secondary | ICD-10-CM | POA: Diagnosis not present

## 2020-02-19 NOTE — Progress Notes (Signed)
GYNECOLOGY OFFICE VISIT NOTE  History:   Monica Floyd is a 49 y.o. [phone] here today for follow up AUB.  She is on Megace for management, also on Eliquis for anticoagulation for PE and DVT earlier this year.  Bleeding was controlled on Megace 40 mg daily, but she reports daily bleeding for last few weeks.  She is wanting to discontinue the Eliquis but her provider is hesitant to do this while on Megace given the rare risk of VTE on Megace.  She wants to discuss getting an endometrial ablation, does not want escalating doses of Megace as she is also worried about recent weight gain.  She denies any  other concerns.    Past Medical History:  Diagnosis Date  . DVT (deep venous thrombosis) ([place])   . GERD (gastroesophageal reflux disease)   . Pulmonary embolism ([place]) 2021  . Seasonal allergies     Past Surgical History:  Procedure Laterality Date  . KNEE ARTHROSCOPY W/ ACL RECONSTRUCTION Right 2012    The following portions of the patient's history were reviewed and updated as appropriate: allergies, current medications, past family history, past medical history, past social history, past surgical history and problem list.   Health Maintenance:   Normal pap and negative HRHPV on 11/27/2019.  Normal mammogram on 11/26/2019.   Review of Systems:  Pertinent items noted in HPI and remainder of comprehensive ROS otherwise negative.  Physical Exam:  BP 133/88   Pulse 72   Wt 234 lb (106.1 kg)   BMI 37.77 kg/m  CONSTITUTIONAL: Well-developed, well-nourished female in no acute distress.  SKIN: No rash noted. Not diaphoretic. No erythema. No pallor. MUSCULOSKELETAL: Normal range of motion. No edema noted. NEUROLOGIC: Alert and oriented to person, place, and time. Normal muscle tone coordination. No cranial nerve deficit noted. PSYCHIATRIC: Normal mood and affect. Normal behavior. Normal judgment and thought content. CARDIOVASCULAR: Normal heart rate noted RESPIRATORY: Effort and breath  sounds normal, no problems with respiration noted ABDOMEN: No masses noted. No other overt distention noted.   PELVIC: Deferred  Labs and Imaging MM 3D SCREEN BREAST BILATERAL  Result Date: 11/26/2019 CLINICAL DATA:  Screening. EXAM: DIGITAL SCREENING BILATERAL MAMMOGRAM WITH TOMO AND CAD COMPARISON:  Previous exam(s). ACR Breast Density Category b: There are scattered areas of fibroglandular density. FINDINGS: There are no findings suspicious for malignancy. Images were processed with CAD. IMPRESSION: No mammographic evidence of malignancy. A result letter of this screening mammogram will be mailed directly to the patient. RECOMMENDATION: Screening mammogram in one year. (Code:SM-B-01Y) BI-RADS CATEGORY  1: Negative. Electronically Signed   By: [name] M.D.   On: 11/26/2019 13:12   US PELVIC COMPLETE WITH TRANSVAGINAL  Result Date: 11/27/2019 CLINICAL DATA:  Abnormal uterine bleeding, LMP 11/10/2019 EXAM: TRANSABDOMINAL AND TRANSVAGINAL ULTRASOUND OF PELVIS TECHNIQUE: Both transabdominal and transvaginal ultrasound examinations of the pelvis were performed. Transabdominal technique was performed for global imaging of the pelvis including uterus, ovaries, adnexal regions, and pelvic cul-de-sac. It was necessary to proceed with endovaginal exam following the transabdominal exam to visualize the uterus, endometrium, and ovaries. COMPARISON:  03/29/2017 FINDINGS: Uterus Measurements: 8.8 x 5.4 x 5.5 cm = volume: 137 mL. Heterogeneous myometrium. Anteverted. Small subserosal leiomyoma at posterior upper uterus 1.9 x 1.0 x 1.8 cm. Additional small exophytic leiomyoma at anterior upper uterus 1.2 x 1.1 x 1.8 cm. Nabothian cyst at cervix. Endometrium Thickness: 5 mm.  No endometrial fluid or focal abnormality Right ovary Measurements: 2.7 x 1.8 x 1.7 cm =  volume: 4.3 mL. Normal morphology without mass Left ovary Measurements: 2.6 x 1.3 x 1.1 cm = volume: 2.0 mL. Normal morphology without mass Other findings  No free pelvic fluid.  No adnexal masses. IMPRESSION: Two small uterine leiomyomata as above. Otherwise normal exam. Electronically Signed   By: [name] M.D.   On: 11/27/2019 16:34       Assessment and Plan:       1. Abnormal uterine bleeding (AUB) 2. Current use of long term anticoagulation Discussed endometrial ablation modalities (Hydrothermal vs Novasure va Minerva).  Recommended Hydrothermal Ablation (HTA) based on personal preference but told her to research all options and we can perform whichever modality she prefers.  The risks of surgery were discussed in detail with the patient including but not limited to: bleeding; infection which may require antibiotics; injury to uterus leading to risk of injury to surrounding intraperitoneal organs, burn injury to vagina or other organs, need for additional procedures including laparoscopy or laparotomy, inability to complete ablation due to uterine or mechanical anomaly, need for trial of another ablation modality, and other postoperative/anesthesia complications.  Patient was informed that there is a high likelihood of success of controlling her symptoms; however about 5% of patients may require further intervention. The patient also understands the alternative treatment options (continued medical therapy vs hysterectomy) which were discussed in full. All questions were answered.  She was told that she will be contacted by our surgical scheduler regarding the time and date of her surgery; routine preoperative instructions will be given to her by the preoperative nursing team.   She is aware of need for preoperative COVID testing and subsequent quarantine from time of test to time of surgery; she will be given further preoperative instructions at that [place] screening visit.  Printed patient education handouts about the procedure were given to the patient to review at home.  In the meantime, recommended trial of Megace 80 mg daily to see if it can  control her bleeding and then can back down to 40 mg once bleeding is better. She will consider this. Discussed that the risk of VTE was less, especially when compared to estrogen treatment. Bleeding precautions reviewed.  Please refer to After Visit Summary for other counseling recommendations.   Return for any gynecologic concerns.    Total face-to-face time with patient: 20 minutes.  Over 50% of encounter was spent on counseling and coordination of care.   Verita Schneiders, MD, [place] for [employer], [place]

## 2020-02-19 NOTE — Patient Instructions (Signed)
Hydrothermal Ablation vs Novasure vs Minerva    Endometrial Ablation Endometrial ablation is a procedure that destroys the thin inner layer of the lining of the uterus (endometrium). This procedure may be done:  To stop heavy periods.  To stop bleeding that is causing anemia.  To control irregular bleeding.  To treat bleeding caused by small tumors (fibroids) in the endometrium. This procedure is often an alternative to major surgery, such as removal of the uterus and cervix (hysterectomy). As a result of this procedure:  You may not be able to have children. However, if you are premenopausal (you have not gone through menopause): ? You may still have a small chance of getting pregnant. ? You will need to use a reliable method of birth control after the procedure to prevent pregnancy.  You may stop having a menstrual period, or you may have only a small amount of bleeding during your period. Menstruation may return several years after the procedure. Tell a health care provider about:  Any allergies you have.  All medicines you are taking, including vitamins, herbs, eye drops, creams, and over-the-counter medicines.  Any problems you or family members have had with the use of anesthetic medicines.  Any blood disorders you have.  Any surgeries you have had.  Any medical conditions you have. What are the risks? Generally, this is a safe procedure. However, problems may occur, including:  A hole (perforation) in the uterus or bowel.  Infection of the uterus, bladder, or vagina.  Bleeding.  Damage to other structures or organs.  An air bubble in the lung (air embolus).  Problems with pregnancy after the procedure.  Failure of the procedure.  Decreased ability to diagnose cancer in the endometrium. What happens before the procedure?  You will have tests of your endometrium to make sure there are no pre-cancerous cells or cancer cells present.  You may have an  ultrasound of the uterus.  You may be given medicines to thin the endometrium.  Ask your health care provider about: ? Changing or stopping your regular medicines. This is especially important if you take diabetes medicines or blood thinners. ? Taking medicines such as aspirin and ibuprofen. These medicines can thin your blood. Do not take these medicines before your procedure if your doctor tells you not to.  Plan to have someone take you home from the hospital or clinic. What happens during the procedure?   You will lie on an exam table with your feet and legs supported as in a pelvic exam.  To lower your risk of infection: ? Your health care team will wash or sanitize their hands and put on germ-free (sterile) gloves. ? Your genital area will be washed with soap.  An IV tube will be inserted into one of your veins.  You will be given a medicine to help you relax (sedative).  A surgical instrument with a light and camera (resectoscope) will be inserted into your vagina and moved into your uterus. This allows your surgeon to see inside your uterus.  Endometrial tissue will be removed using one of the following methods: ? Radiofrequency. This method uses a radiofrequency-alternating electric current to remove the endometrium. ? Cryotherapy. This method uses extreme cold to freeze the endometrium. ? Heated-free liquid. This method uses a heated saltwater (saline) solution to remove the endometrium. ? Microwave. This method uses high-energy microwaves to heat up the endometrium and remove it. ? Thermal balloon. This method involves inserting a catheter with a balloon tip  into the uterus. The balloon tip is filled with heated fluid to remove the endometrium. The procedure may vary among health care providers and hospitals. What happens after the procedure?  Your blood pressure, heart rate, breathing rate, and blood oxygen level will be monitored until the medicines you were given have  worn off.  As tissue healing occurs, you may notice vaginal bleeding for 4-6 weeks after the procedure. You may also experience: ? Cramps. ? Thin, watery vaginal discharge that is light pink or brown in color. ? A need to urinate more frequently than usual. ? Nausea.  Do not drive for 24 hours if you were given a sedative.  Do not have sex or insert anything into your vagina until your health care provider approves. Summary  Endometrial ablation is done to treat the many causes of heavy menstrual bleeding.  The procedure may be done only after medications have been tried to control the bleeding.  Plan to have someone take you home from the hospital or clinic. This information is not intended to replace advice given to you by your health care provider. Make sure you discuss any questions you have with your health care provider. Document Revised: 12/13/2017 Document Reviewed: 07/15/2016 Elsevier Patient Education  [place].

## 2020-02-20 ENCOUNTER — Other Ambulatory Visit: Payer: Self-pay | Admitting: Nephrology

## 2020-02-20 DIAGNOSIS — N179 Acute kidney failure, unspecified: Secondary | ICD-10-CM

## 2020-02-20 DIAGNOSIS — R944 Abnormal results of kidney function studies: Secondary | ICD-10-CM

## 2020-02-25 ENCOUNTER — Other Ambulatory Visit (INDEPENDENT_AMBULATORY_CARE_PROVIDER_SITE_OTHER): Payer: Managed Care, Other (non HMO)

## 2020-02-25 ENCOUNTER — Other Ambulatory Visit: Payer: Self-pay

## 2020-02-25 DIAGNOSIS — E559 Vitamin D deficiency, unspecified: Secondary | ICD-10-CM

## 2020-02-25 NOTE — Addendum Note (Signed)
Addended by: Ellamae Sia on: 02/25/2020 07:37 AM   Modules accepted: Orders

## 2020-02-26 LAB — VITAMIN D 25 HYDROXY (VIT D DEFICIENCY, FRACTURES): Vit D, 25-Hydroxy: 50 ng/mL (ref 30.0–100.0)

## 2020-02-29 ENCOUNTER — Ambulatory Visit
Admission: RE | Admit: 2020-02-29 | Discharge: 2020-02-29 | Disposition: A | Discharge status: Home / Self Care | Payer: Managed Care, Other (non HMO) | Source: Ambulatory Visit | Attending: Nephrology | Admitting: Nephrology

## 2020-02-29 ENCOUNTER — Other Ambulatory Visit: Payer: Self-pay

## 2020-02-29 DIAGNOSIS — R944 Abnormal results of kidney function studies: Secondary | ICD-10-CM | POA: Diagnosis present

## 2020-02-29 DIAGNOSIS — N179 Acute kidney failure, unspecified: Secondary | ICD-10-CM | POA: Diagnosis not present

## 2020-03-31 ENCOUNTER — Telehealth: Payer: Self-pay

## 2020-03-31 NOTE — Telephone Encounter (Signed)
May proceed with endoscopy. Thanks.

## 2020-03-31 NOTE — Telephone Encounter (Signed)
Patient has been scheduled for an endo/col on 04/22/2020;  Patient is currently also scheduled for a hysteroscopy with hydrothermal ablation and d/c on 04/16/2020;  Patient is listed to be on Eliquis ;   Patient had an OV with APP on 11/23/2019 and was instructed to have procedure scheduled after 6 months of being on anticoagulation.  Patient did not schedule procedure.  Patient has now been scheduled for 04/22/2020.  Does she need a repeat OV since it has been greater than 3 months since you have seen this patient?  Does this patient need to be rescheduled for the egd/colon at a later time once she has been cleared by GYN?  Please advise

## 2020-04-02 ENCOUNTER — Telehealth: Payer: Self-pay | Admitting: *Deleted

## 2020-04-02 NOTE — Telephone Encounter (Signed)
Left message for patient to advise if she is still taking Elilquis.  Please note when she returns call. Thanks!

## 2020-04-02 NOTE — Telephone Encounter (Signed)
Hi Raquel Sarna!   Patient returned your phone call. Pt states she is no longer taking Eliquis.. Thanks

## 2020-04-04 NOTE — Telephone Encounter (Signed)
Endoscopy to include EGD and colonoscopy. Thank you.

## 2020-04-04 NOTE — Telephone Encounter (Signed)
For clarification purposes- schedule patient for ENDO procedure ONLY? Please advise

## 2020-04-07 ENCOUNTER — Ambulatory Visit: Payer: Managed Care, Other (non HMO) | Admitting: Internal Medicine

## 2020-04-07 NOTE — Telephone Encounter (Signed)
Per response from Dr.Beavers-patient will be scheduled for EGD/colon;  patient returned call to the office and reports she is no longer taking Eliquis; this has been noted on the pre visit chart;

## 2020-04-08 ENCOUNTER — Other Ambulatory Visit: Payer: Self-pay

## 2020-04-08 ENCOUNTER — Ambulatory Visit (AMBULATORY_SURGERY_CENTER): Payer: Managed Care, Other (non HMO) | Admitting: *Deleted

## 2020-04-08 VITALS — Ht 66.0 in | Wt 237.2 lb

## 2020-04-08 DIAGNOSIS — R1310 Dysphagia, unspecified: Secondary | ICD-10-CM

## 2020-04-08 DIAGNOSIS — Z1211 Encounter for screening for malignant neoplasm of colon: Secondary | ICD-10-CM

## 2020-04-08 MED ORDER — SUTAB 1479-225-188 MG PO TABS: 1.0000 | ORAL_TABLET | ORAL | 0 refills | Status: DC

## 2020-04-08 NOTE — Progress Notes (Signed)
Patient denies any allergies to egg or soy products. Patient denies complications with anesthesia/sedation.  Patient denies oxygen use at home and denies diet medications. Emmi instructions for colonoscopy/endoscopy explained and sent via [email or web address] Kitchen

## 2020-04-09 ENCOUNTER — Encounter: Payer: Self-pay | Admitting: Gastroenterology

## 2020-04-10 ENCOUNTER — Encounter (HOSPITAL_BASED_OUTPATIENT_CLINIC_OR_DEPARTMENT_OTHER): Payer: Self-pay | Admitting: Obstetrics & Gynecology

## 2020-04-10 ENCOUNTER — Other Ambulatory Visit: Payer: Self-pay

## 2020-04-12 ENCOUNTER — Other Ambulatory Visit (HOSPITAL_COMMUNITY): Payer: Managed Care, Other (non HMO)

## 2020-04-14 ENCOUNTER — Encounter (HOSPITAL_BASED_OUTPATIENT_CLINIC_OR_DEPARTMENT_OTHER)
Admission: RE | Admit: 2020-04-14 | Discharge: 2020-04-14 | Disposition: A | Discharge status: Home / Self Care | Payer: Managed Care, Other (non HMO) | Source: Ambulatory Visit | Attending: Obstetrics & Gynecology | Admitting: Obstetrics & Gynecology

## 2020-04-14 ENCOUNTER — Other Ambulatory Visit (HOSPITAL_COMMUNITY)
Admission: RE | Admit: 2020-04-14 | Discharge: 2020-04-14 | Disposition: A | Discharge status: Home / Self Care | Payer: Managed Care, Other (non HMO) | Source: Ambulatory Visit | Attending: Obstetrics & Gynecology | Admitting: Obstetrics & Gynecology

## 2020-04-14 DIAGNOSIS — Z01812 Encounter for preprocedural laboratory examination: Secondary | ICD-10-CM | POA: Insufficient documentation

## 2020-04-14 DIAGNOSIS — Z20822 Contact with and (suspected) exposure to covid-19: Secondary | ICD-10-CM | POA: Insufficient documentation

## 2020-04-14 LAB — BASIC METABOLIC PANEL
Anion gap: 14 (ref 5–15)
BUN: 14 mg/dL (ref 6–20)
CO2: 23 mmol/L (ref 22–32)
Calcium: 9.8 mg/dL (ref 8.9–10.3)
Chloride: 102 mmol/L (ref 98–111)
Creatinine, Ser: 1.14 mg/dL — ABNORMAL HIGH (ref 0.44–1.00)
GFR calc Af Amer: >60 mL/min (ref >60)
GFR calc non Af Amer: 56 mL/min — ABNORMAL LOW (ref >60)
Glucose, Bld: 87 mg/dL (ref 70–99)
Potassium: 4.8 mmol/L (ref 3.5–5.1)
Sodium: 139 mmol/L (ref 135–145)

## 2020-04-14 LAB — CBC
HCT: 45.6 % (ref 36.0–46.0)
Hemoglobin: 15.1 g/dL — ABNORMAL HIGH (ref 12.0–15.0)
MCH: 31.1 pg (ref 26.0–34.0)
MCHC: 33.1 g/dL (ref 30.0–36.0)
MCV: 93.8 fL (ref 80.0–100.0)
Platelets: 293 10*3/uL (ref 150–400)
RBC: 4.86 MIL/uL (ref 3.87–5.11)
RDW: 12.6 % (ref 11.5–15.5)
WBC: 7.1 10*3/uL (ref 4.0–10.5)
nRBC: 0 % (ref 0.0–0.2)

## 2020-04-14 LAB — PROTIME-INR
INR: 1 (ref 0.8–1.2)
Prothrombin Time: 12.7 seconds (ref 11.4–15.2)

## 2020-04-14 LAB — APTT: aPTT: 26 seconds (ref 24–36)

## 2020-04-14 LAB — POCT PREGNANCY, URINE: Preg Test, Ur: NEGATIVE

## 2020-04-14 LAB — SARS CORONAVIRUS 2 (TAT 6-24 HRS): SARS Coronavirus 2: NEGATIVE

## 2020-04-14 NOTE — Progress Notes (Signed)

## 2020-04-16 ENCOUNTER — Ambulatory Visit (HOSPITAL_BASED_OUTPATIENT_CLINIC_OR_DEPARTMENT_OTHER)
Admission: RE | Admit: 2020-04-16 | Discharge: 2020-04-16 | Disposition: A | Discharge status: Home / Self Care | Payer: Managed Care, Other (non HMO) | Attending: Obstetrics & Gynecology | Admitting: Obstetrics & Gynecology

## 2020-04-16 ENCOUNTER — Other Ambulatory Visit: Payer: Self-pay

## 2020-04-16 ENCOUNTER — Ambulatory Visit (HOSPITAL_BASED_OUTPATIENT_CLINIC_OR_DEPARTMENT_OTHER): Payer: Managed Care, Other (non HMO) | Admitting: Anesthesiology

## 2020-04-16 ENCOUNTER — Encounter (HOSPITAL_BASED_OUTPATIENT_CLINIC_OR_DEPARTMENT_OTHER): Payer: Self-pay | Admitting: Obstetrics & Gynecology

## 2020-04-16 ENCOUNTER — Encounter (HOSPITAL_BASED_OUTPATIENT_CLINIC_OR_DEPARTMENT_OTHER)
Admission: RE | Disposition: A | Discharge status: Home / Self Care | Payer: Self-pay | Source: Home / Self Care | Attending: Obstetrics & Gynecology

## 2020-04-16 DIAGNOSIS — N939 Abnormal uterine and vaginal bleeding, unspecified: Secondary | ICD-10-CM | POA: Diagnosis not present

## 2020-04-16 DIAGNOSIS — Z7901 Long term (current) use of anticoagulants: Secondary | ICD-10-CM | POA: Insufficient documentation

## 2020-04-16 DIAGNOSIS — E669 Obesity, unspecified: Secondary | ICD-10-CM | POA: Diagnosis not present

## 2020-04-16 DIAGNOSIS — Z803 Family history of malignant neoplasm of breast: Secondary | ICD-10-CM | POA: Diagnosis not present

## 2020-04-16 DIAGNOSIS — Z820 Family history of epilepsy and other diseases of the nervous system: Secondary | ICD-10-CM | POA: Insufficient documentation

## 2020-04-16 DIAGNOSIS — Z825 Family history of asthma and other chronic lower respiratory diseases: Secondary | ICD-10-CM | POA: Insufficient documentation

## 2020-04-16 DIAGNOSIS — D252 Subserosal leiomyoma of uterus: Secondary | ICD-10-CM | POA: Insufficient documentation

## 2020-04-16 DIAGNOSIS — Z832 Family history of diseases of the blood and blood-forming organs and certain disorders involving the immune mechanism: Secondary | ICD-10-CM | POA: Diagnosis not present

## 2020-04-16 DIAGNOSIS — N938 Other specified abnormal uterine and vaginal bleeding: Secondary | ICD-10-CM | POA: Diagnosis present

## 2020-04-16 DIAGNOSIS — Z8042 Family history of malignant neoplasm of prostate: Secondary | ICD-10-CM | POA: Insufficient documentation

## 2020-04-16 DIAGNOSIS — F419 Anxiety disorder, unspecified: Secondary | ICD-10-CM | POA: Insufficient documentation

## 2020-04-16 DIAGNOSIS — Z79899 Other long term (current) drug therapy: Secondary | ICD-10-CM | POA: Diagnosis not present

## 2020-04-16 DIAGNOSIS — I82409 Acute embolism and thrombosis of unspecified deep veins of unspecified lower extremity: Secondary | ICD-10-CM | POA: Diagnosis present

## 2020-04-16 DIAGNOSIS — Z86711 Personal history of pulmonary embolism: Secondary | ICD-10-CM | POA: Diagnosis not present

## 2020-04-16 DIAGNOSIS — K219 Gastro-esophageal reflux disease without esophagitis: Secondary | ICD-10-CM | POA: Insufficient documentation

## 2020-04-16 DIAGNOSIS — Z6838 Body mass index (BMI) 38.0-38.9, adult: Secondary | ICD-10-CM | POA: Diagnosis not present

## 2020-04-16 DIAGNOSIS — N888 Other specified noninflammatory disorders of cervix uteri: Secondary | ICD-10-CM | POA: Insufficient documentation

## 2020-04-16 DIAGNOSIS — Z7982 Long term (current) use of aspirin: Secondary | ICD-10-CM | POA: Diagnosis not present

## 2020-04-16 DIAGNOSIS — Z86718 Personal history of other venous thrombosis and embolism: Secondary | ICD-10-CM | POA: Diagnosis not present

## 2020-04-16 DIAGNOSIS — R7989 Other specified abnormal findings of blood chemistry: Secondary | ICD-10-CM | POA: Diagnosis not present

## 2020-04-16 HISTORY — PX: DILITATION & CURRETTAGE/HYSTROSCOPY WITH HYDROTHERMAL ABLATION: SHX5570

## 2020-04-16 HISTORY — DX: Abnormal uterine and vaginal bleeding, unspecified: N93.9

## 2020-04-16 SURGERY — DILATATION & CURETTAGE/HYSTEROSCOPY WITH HYDROTHERMAL ABLATION
Anesthesia: General | Site: Uterus

## 2020-04-16 MED ORDER — PHENYLEPHRINE 40 MCG/ML (10ML) SYRINGE FOR IV PUSH (FOR BLOOD PRESSURE SUPPORT)
PREFILLED_SYRINGE | INTRAVENOUS | Status: DC | PRN
  Administered 2020-04-16: 80 ug via INTRAVENOUS

## 2020-04-16 MED ORDER — DEXAMETHASONE SODIUM PHOSPHATE 4 MG/ML IJ SOLN
INTRAMUSCULAR | Status: DC | PRN
  Administered 2020-04-16: 10 mg via INTRAVENOUS

## 2020-04-16 MED ORDER — MIDAZOLAM HCL 2 MG/2ML IJ SOLN
INTRAMUSCULAR | Status: AC
  Filled 2020-04-16: qty 2

## 2020-04-16 MED ORDER — LACTATED RINGERS IV SOLN: INTRAVENOUS | Status: DC

## 2020-04-16 MED ORDER — POVIDONE-IODINE 10 % EX SWAB: 2.0000 "application " | Freq: Once | CUTANEOUS | Status: DC

## 2020-04-16 MED ORDER — FENTANYL CITRATE (PF) 100 MCG/2ML IJ SOLN
INTRAMUSCULAR | Status: AC
  Filled 2020-04-16: qty 2

## 2020-04-16 MED ORDER — CELECOXIB 200 MG PO CAPS
ORAL_CAPSULE | ORAL | Status: AC
  Filled 2020-04-16: qty 1

## 2020-04-16 MED ORDER — FENTANYL CITRATE (PF) 100 MCG/2ML IJ SOLN
INTRAMUSCULAR | Status: DC | PRN
Start: 2020-04-16 — End: 2020-04-16
  Administered 2020-04-16 (×2): 50 ug via INTRAVENOUS

## 2020-04-16 MED ORDER — DOCUSATE SODIUM 100 MG PO CAPS: 100.0000 mg | ORAL_CAPSULE | Freq: Two times a day (BID) | ORAL | 0 refills | Status: DC | PRN

## 2020-04-16 MED ORDER — CELECOXIB 200 MG PO CAPS
400.0000 mg | ORAL_CAPSULE | ORAL | Status: AC
  Administered 2020-04-16: 400 mg via ORAL

## 2020-04-16 MED ORDER — FENTANYL CITRATE (PF) 100 MCG/2ML IJ SOLN: 25.0000 ug | INTRAMUSCULAR | Status: DC | PRN

## 2020-04-16 MED ORDER — ONDANSETRON HCL 4 MG/2ML IJ SOLN
INTRAMUSCULAR | Status: DC | PRN
  Administered 2020-04-16: 4 mg via INTRAVENOUS

## 2020-04-16 MED ORDER — MIDAZOLAM HCL 5 MG/5ML IJ SOLN
INTRAMUSCULAR | Status: DC | PRN
  Administered 2020-04-16: 2 mg via INTRAVENOUS

## 2020-04-16 MED ORDER — LIDOCAINE 2% (20 MG/ML) 5 ML SYRINGE
INTRAMUSCULAR | Status: DC | PRN
  Administered 2020-04-16: 60 mg via INTRAVENOUS

## 2020-04-16 MED ORDER — BUPIVACAINE HCL 0.5 % IJ SOLN
INTRAMUSCULAR | Status: DC | PRN
  Administered 2020-04-16: 30 mL

## 2020-04-16 MED ORDER — SODIUM CHLORIDE 0.9 % IR SOLN
Status: DC | PRN
  Administered 2020-04-16: 3000 mL

## 2020-04-16 MED ORDER — PROPOFOL 10 MG/ML IV BOLUS
INTRAVENOUS | Status: AC
  Filled 2020-04-16: qty 20

## 2020-04-16 MED ORDER — OXYCODONE-ACETAMINOPHEN 5-325 MG PO TABS: 1.0000 | ORAL_TABLET | ORAL | 0 refills | Status: DC | PRN

## 2020-04-16 MED ORDER — OXYCODONE HCL 5 MG/5ML PO SOLN: 5.0000 mg | Freq: Once | ORAL | Status: DC | PRN

## 2020-04-16 MED ORDER — ACETAMINOPHEN 500 MG PO TABS
1000.0000 mg | ORAL_TABLET | ORAL | Status: AC
  Administered 2020-04-16: 1000 mg via ORAL

## 2020-04-16 MED ORDER — ACETAMINOPHEN 500 MG PO TABS
ORAL_TABLET | ORAL | Status: AC
  Filled 2020-04-16: qty 2

## 2020-04-16 MED ORDER — PROPOFOL 10 MG/ML IV BOLUS
INTRAVENOUS | Status: DC | PRN
  Administered 2020-04-16: 200 mg via INTRAVENOUS

## 2020-04-16 MED ORDER — OXYCODONE HCL 5 MG PO TABS: 5.0000 mg | ORAL_TABLET | Freq: Once | ORAL | Status: DC | PRN

## 2020-04-16 MED ORDER — PROMETHAZINE HCL 25 MG/ML IJ SOLN: 6.2500 mg | INTRAMUSCULAR | Status: DC | PRN

## 2020-04-16 SURGICAL SUPPLY — 12 items
CATH ROBINSON RED A/P 16FR (CATHETERS) ×3 IMPLANT
CURETTE PIPELLE ENDOMTRL SUCTN (MISCELLANEOUS) ×1 IMPLANT
GLOVE BIOGEL PI IND STRL 7.0 (GLOVE) ×1 IMPLANT
GLOVE BIOGEL PI INDICATOR 7.0 (GLOVE) ×2
GLOVE ECLIPSE 7.0 STRL STRAW (GLOVE) ×3 IMPLANT
GOWN STRL REUS W/TWL LRG LVL3 (GOWN DISPOSABLE) ×6 IMPLANT
PACK VAGINAL MINOR WOMEN LF (CUSTOM PROCEDURE TRAY) ×3 IMPLANT
PAD OB MATERNITY 4.3X12.25 (PERSONAL CARE ITEMS) ×3 IMPLANT
PIPELLE ENDOMETRIAL SUCTION CU (MISCELLANEOUS) ×3
SET GENESYS HTA PROCERVA (MISCELLANEOUS) ×3 IMPLANT
SLEEVE SCD COMPRESS KNEE MED (MISCELLANEOUS) ×3 IMPLANT
TOWEL GREEN STERILE FF (TOWEL DISPOSABLE) ×6 IMPLANT

## 2020-04-16 NOTE — Anesthesia Procedure Notes (Signed)
Procedure Name: LMA Insertion Date/Time: 04/16/2020 12:52 PM Performed by: Lieutenant Diego, CRNA Pre-anesthesia Checklist: Patient identified, Emergency Drugs available, Suction available and Patient being monitored Patient Re-evaluated:Patient Re-evaluated prior to induction Oxygen Delivery Method: Circle system utilized Preoxygenation: Pre-oxygenation with 100% oxygen Induction Type: IV induction Ventilation: Mask ventilation without difficulty LMA: LMA inserted LMA Size: 4.0 Number of attempts: 1 Placement Confirmation: positive ETCO2 and breath sounds checked- equal and bilateral Tube secured with: Tape Dental Injury: Teeth and Oropharynx as per pre-operative assessment

## 2020-04-16 NOTE — Anesthesia Postprocedure Evaluation (Signed)
Anesthesia Post Note  Patient: Monica Floyd  Procedure(s) Performed: DILATATION & CURETTAGE/HYSTEROSCOPY WITH HYDROTHERMAL ENDOMETRIAL ABLATION (N/A Uterus)     Patient location during evaluation: PACU Anesthesia Type: General Level of consciousness: awake and alert Pain management: pain level controlled Vital Signs Assessment: post-procedure vital signs reviewed and stable Respiratory status: spontaneous breathing, nonlabored ventilation and respiratory function stable Cardiovascular status: blood pressure returned to baseline and stable Postop Assessment: no apparent nausea or vomiting Anesthetic complications: no   No complications documented.  Last Vitals:  Vitals:   04/16/20 1352 04/16/20 1400  BP:  (!) 135/96  Pulse:  80  Resp:  13  Temp:    SpO2: 100% 96%    Last Pain:  Vitals:   04/16/20 1400  TempSrc:   PainSc: [place]

## 2020-04-16 NOTE — Op Note (Signed)
PREOPERATIVE DIAGNOSIS:  Abnormal uterine bleeding POSTOPERATIVE DIAGNOSIS: The same PROCEDURE: Hysteroscopy, Dilation and Curettage,  Hydrothermal Endometrial Ablation SURGEON:  Dr. Verita Schneiders  INDICATIONS: 49 y.o. [id number] here for scheduled surgery for abnormal uterine bleeding. Risks of surgery were discussed with the patient including but not limited to: bleeding; infection which may require antibiotics; injury to uterus leading to risk of injury to surrounding intraperitoneal organs, burn injury to vagina or other organs, need for additional procedures including laparoscopy or laparotomy, inability to complete ablation due to uterine or mechanical anomaly, need for trial of another ablation modality, and other postoperative/anesthesia complications.  Patient was informed that there is a high likelihood of success of controlling her symptoms; however about 5% of patients may require further intervention.  Written informed consent was obtained.    FINDINGS:  A 9 week size uterus.  Normal appearing endometrium diffusely.  Normal ostia bilaterally.  ANESTHESIA:   General, paracervical block. ESTIMATED BLOOD LOSS:  3 ml SPECIMENS: Endometrial curettings sent to pathology COMPLICATIONS:  None immediate.  PROCEDURE DETAILS:  The patient was then taken to the operating room where general anesthesia was administered and was found to be adequate.  After an adequate timeout was performed, she was placed in the dorsal lithotomy position and examined; then prepped and draped in the sterile manner.   Her bladder was catheterized for clear, yellow urine. A speculum was then placed in the patient's vagina and a single tooth tenaculum was applied to the anterior lip of the cervix.   A paracervical block using 30 ml of 0.5% Marcaine was administered.  The cervix was sounded to 9 cm and dilated manually with metal dilators to accommodate the hydrothermal ablation hysteroscopic apparatus.  Once the cervix was  dilated, a  gentle curettage was then performed to obtain a small amount of endometrial curettings.  The hysteroscope was then inserted under direct visualization using normal saline as a suspension medium.  The uterine cavity was carefully examined, both ostia were recognized, and diffusely proliferative endometrium was noted.   The hydrothermal ablation was then carried out as per protocol.   Complete ablation of the endometrium was observed and the hysteroscope was removed under direct visualization.  No complications were observed.  The tenaculum was removed from the anterior lip of the cervix, and the vaginal speculum was removed after noting good hemostasis.  The patient tolerated the procedure well and was taken to the recovery area awake, extubated and in stable condition.  The patient will be discharged to home as per PACU criteria.  Routine postoperative instructions given.  She was prescribed Percocet and Colace.  She will follow up in the office in 2-3 weeks  for postoperative evaluation.   Verita Schneiders, MD, [place] for [facility], [place]

## 2020-04-16 NOTE — Anesthesia Preprocedure Evaluation (Addendum)
Anesthesia Evaluation  Patient identified by MRN, date of birth, ID band Patient awake    Reviewed: Allergy & Precautions, NPO status , Patient's Chart, lab work & pertinent test results  History of Anesthesia Complications Negative for: history of anesthetic complications  Airway Mallampati: II  TM Distance: >3 FB Neck ROM: Full    Dental  (+) Dental Advisory Given, Teeth Intact   Pulmonary PE   Pulmonary exam normal        Cardiovascular + DVT  Normal cardiovascular exam     Neuro/Psych PSYCHIATRIC DISORDERS Anxiety negative neurological ROS     GI/Hepatic Neg liver ROS, GERD  Medicated and Controlled,  Endo/Other   Obesity   Renal/GU Renal InsufficiencyRenal disease     Musculoskeletal negative musculoskeletal ROS (+)   Abdominal   Peds  Hematology negative hematology ROS (+)   Anesthesia Other Findings Covid infection 06/2019 Covid test negative   Reproductive/Obstetrics                            Anesthesia Physical Anesthesia Plan  ASA: II  Anesthesia Plan: General   Post-op Pain Management:    Induction: Intravenous  PONV Risk Score and Plan: 3 and Treatment may vary due to age or medical condition, Ondansetron, Dexamethasone and Midazolam  Airway Management Planned: LMA  Additional Equipment: None  Intra-op Plan:   Post-operative Plan: Extubation in OR  Informed Consent: I have reviewed the patients History and Physical, chart, labs and discussed the procedure including the risks, benefits and alternatives for the proposed anesthesia with the patient or authorized representative who has indicated his/her understanding and acceptance.     Dental advisory given  Plan Discussed with: CRNA and Anesthesiologist  Anesthesia Plan Comments:        Anesthesia Quick Evaluation

## 2020-04-16 NOTE — H&P (Signed)
Preoperative History and Physical  Monica Floyd is a 49 y.o. [id number] here for surgical management of abnormal uterine bleeding.  Had been on chronic anticoagulation for VTE, recently stopped Eliquis about three weeks ago. No bleeding since then, but still wants to proceed with surgery.   No significant preoperative concerns.  Proposed surgery: Hysteroscopy, Dilation and Curettage, Hydrothermal Endometrial Ablation.  Past Medical History:  Diagnosis Date  . Abnormal uterine bleeding (AUB) 04/16/2020  . Allergy   . Anxiety   . DVT (deep venous thrombosis) ([place])   . GERD (gastroesophageal reflux disease)   . Pulmonary embolism ([place]) 2021  . Seasonal allergies    Past Surgical History:  Procedure Laterality Date  . KNEE ARTHROSCOPY W/ ACL RECONSTRUCTION Right 2012  . WISDOM TOOTH EXTRACTION     OB History  Gravida Para Term Preterm AB Living  2 2 2     2   SAB TAB Ectopic Multiple Live Births          2    # Outcome Date GA Lbr Len/2nd Weight Sex Delivery Anes PTL Lv  2 Term      Vag-Spont   LIV  1 Term      Vag-Spont   LIV  Patient denies any other pertinent gynecologic issues.   No current facility-administered medications on file prior to encounter.   Current Outpatient Medications on File Prior to Encounter  Medication Sig Dispense Refill  . aspirin EC 81 MG tablet Take 81 mg by mouth daily. Swallow whole.    . cetirizine (ZYRTEC) 10 MG tablet Take 10 mg by mouth daily.    Marland Kitchen omeprazole (PRILOSEC) 40 MG capsule Take 1 capsule (40 mg total) by mouth daily. 60 capsule 2  . hydrOXYzine (ATARAX/VISTARIL) 10 MG tablet TAKE 1 TABLET BY MOUTH EVERY DAY AS NEEDED 30 tablet 0   No Known Allergies  Social History:   reports that she has never smoked. She has never used smokeless tobacco. She reports current alcohol use of about 4.0 - 5.0 standard drinks of alcohol per week. She reports that she does not use drugs.  Family History  Problem Relation Age of Onset  . Breast cancer  Mother   . Pernicious anemia Mother   . Cancer Father        prostate/bladder  . COPD Father   . Breast cancer Maternal Grandmother 16  . Alzheimer's disease Maternal Grandmother   . Breast cancer Maternal Aunt 50  . Colon cancer Neg Hx   . Rectal cancer Neg Hx   . Esophageal cancer Neg Hx   . Stomach cancer Neg Hx     Review of Systems: Pertinent items noted in HPI and remainder of comprehensive ROS otherwise negative.  PHYSICAL EXAM: Blood pressure 139/87, pulse 95, temperature 97.7 F (36.5 C), temperature source Oral, resp. rate 16, height 5\' 6"  (1.676 m), weight 107 kg, last menstrual period 04/02/2020, SpO2 99 %. CONSTITUTIONAL: Well-developed, well-nourished female in no acute distress.  HENT:  Normocephalic, atraumatic, External right and left ear normal. Oropharynx is clear and moist EYES: Conjunctivae and EOM are normal. Pupils are equal, round, and reactive to light. No scleral icterus.  NECK: Normal range of motion, supple, no masses SKIN: Skin is warm and dry. No rash noted. Not diaphoretic. No erythema. No pallor. NEUROLOGIC: Alert and oriented to person, place, and time. Normal reflexes, muscle tone coordination. No cranial nerve deficit noted. PSYCHIATRIC: Normal mood and affect. Normal behavior. Normal judgment and thought content. CARDIOVASCULAR: Normal  heart rate noted, regular rhythm RESPIRATORY: Effort and breath sounds normal, no problems with respiration noted ABDOMEN: Soft, nontender, nondistended. PELVIC: Deferred MUSCULOSKELETAL: Normal range of motion. No edema and no tenderness. 2+ distal pulses.  Labs: Results for orders placed or performed during the hospital encounter of 04/16/20 (from the past 336 hour(s))  CBC   Collection Time: 04/14/20 10:03 AM  Result Value Ref Range   WBC 7.1 4.0 - 10.5 K/uL   RBC 4.86 3.87 - 5.11 MIL/uL   Hemoglobin 15.1 (H) 12.0 - 15.0 g/dL   HCT 45.6 36 - 46 %   MCV 93.8 80.0 - 100.0 fL   MCH 31.1 26.0 - 34.0 pg    MCHC 33.1 30.0 - 36.0 g/dL   RDW 12.6 11.5 - 15.5 %   Platelets 293 150 - 400 K/uL   nRBC 0.0 0.0 - 0.2 %  Protime-INR   Collection Time: 04/14/20 10:03 AM  Result Value Ref Range   Prothrombin Time 12.7 11.4 - 15.2 seconds   INR 1.0 0.8 - 1.2  APTT   Collection Time: 04/14/20 10:03 AM  Result Value Ref Range   aPTT 26 24 - 36 seconds  Basic metabolic panel   Collection Time: 04/14/20 10:03 AM  Result Value Ref Range   Sodium 139 135 - 145 mmol/L   Potassium 4.8 3.5 - 5.1 mmol/L   Chloride 102 98 - 111 mmol/L   CO2 23 22 - 32 mmol/L   Glucose, Bld 87 70 - 99 mg/dL   BUN 14 6 - 20 mg/dL   Creatinine, Ser 1.14 (H) 0.44 - 1.00 mg/dL   Calcium 9.8 8.9 - 10.3 mg/dL   GFR calc non Af Amer 56 (L) >60 mL/min   GFR calc Af Amer >60 >60 mL/min   Anion gap 14 5 - 15  Results for orders placed or performed during the hospital encounter of 04/14/20 (from the past 336 hour(s))  Pregnancy, urine POC   Collection Time: 04/14/20 11:14 AM  Result Value Ref Range   Preg Test, Ur NEGATIVE NEGATIVE  Results for orders placed or performed during the hospital encounter of 04/14/20 (from the past 336 hour(s))  SARS CORONAVIRUS 2 (TAT 6-24 HRS) Nasopharyngeal Nasopharyngeal Swab   Collection Time: 04/14/20 10:57 AM   Specimen: Nasopharyngeal Swab  Result Value Ref Range   SARS Coronavirus 2 NEGATIVE NEGATIVE    Imaging Studies: US PELVIC COMPLETE WITH TRANSVAGINAL Result Date: 11/27/2019 CLINICAL DATA:  Abnormal uterine bleeding, LMP 11/10/2019 EXAM: TRANSABDOMINAL AND TRANSVAGINAL ULTRASOUND OF PELVIS TECHNIQUE: Both transabdominal and transvaginal ultrasound examinations of the pelvis were performed. Transabdominal technique was performed for global imaging of the pelvis including uterus, ovaries, adnexal regions, and pelvic cul-de-sac. It was necessary to proceed with endovaginal exam following the transabdominal exam to visualize the uterus, endometrium, and ovaries. COMPARISON:  03/29/2017  FINDINGS: Uterus Measurements: 8.8 x 5.4 x 5.5 cm = volume: 137 mL. Heterogeneous myometrium. Anteverted. Small subserosal leiomyoma at posterior upper uterus 1.9 x 1.0 x 1.8 cm. Additional small exophytic leiomyoma at anterior upper uterus 1.2 x 1.1 x 1.8 cm. Nabothian cyst at cervix. Endometrium Thickness: 5 mm.  No endometrial fluid or focal abnormality Right ovary Measurements: 2.7 x 1.8 x 1.7 cm = volume: 4.3 mL. Normal morphology without mass Left ovary Measurements: 2.6 x 1.3 x 1.1 cm = volume: 2.0 mL. Normal morphology without mass Other findings No free pelvic fluid.  No adnexal masses. IMPRESSION: Two small uterine leiomyomata as above. Otherwise normal exam. Electronically  Signed   By: [name] M.D.   On: 11/27/2019 16:34    Assessment: Principal Problem:   Abnormal uterine bleeding (AUB) Active Problems:   Acute deep vein thrombosis (DVT) of lower extremity (HCC)   Chronic anticoagulation   Elevated serum creatinine   Fibroids, subserous and exophytic  Plan: Patient will undergo surgical management with Hysteroscopy, Dilation and Curettage, Hydrothermal Endometrial Ablation.  The risks of surgery were discussed in detail with the patient including but not limited to: bleeding; infection which may require antibiotics; injury to uterus leading to risk of injury to surrounding intraperitoneal organs, burn injury to vagina or other organs, need for additional procedures including laparoscopy or laparotomy, inability to complete ablation due to uterine or mechanical anomaly, need for trial of another ablation modality, and other postoperative/anesthesia complications.  Patient was informed that there is a high likelihood of success of controlling her symptoms; however about 5% of patients may require further intervention. The patient also understands the alternative treatment options (continued medical therapy vs hysterectomy) which were discussed in full. All questions were answered.  Routine  postoperative instructions will be reviewed with the patient and her family in detail after surgery.  The patient concurred with the proposed plan, giving informed written consent for the surgery.  Patient has been NPO since last night and she will remain NPO for procedure.  Anesthesia and OR aware.  Preoperative SCDs ordered on call to the OR.  To OR when ready.    Verita Schneiders, MD, [place] for [employer] [facility], [place]

## 2020-04-16 NOTE — Transfer of Care (Signed)
Immediate Anesthesia Transfer of Care Note  Patient: Monica Floyd  Procedure(s) Performed: DILATATION & CURETTAGE/HYSTEROSCOPY WITH HYDROTHERMAL ENDOMETRIAL ABLATION (N/A Uterus)  Patient Location: PACU  Anesthesia Type:General  Level of Consciousness: awake  Airway & Oxygen Therapy: Patient Spontanous Breathing and Patient connected to face mask oxygen  Post-op Assessment: Report given to RN and Post -op Vital signs reviewed and stable  Post vital signs: Reviewed and stable  Last Vitals:  Vitals Value Taken Time  BP 146/90 04/16/20 1343  Temp    Pulse 86 04/16/20 1344  Resp 14 04/16/20 1344  SpO2 100 % 04/16/20 1344  Vitals shown include unvalidated device data.  Last Pain:  Vitals:   04/16/20 1154  TempSrc: Oral  PainSc: 0-No pain         Complications: No complications documented.

## 2020-04-16 NOTE — Discharge Instructions (Addendum)
Post Anesthesia Home Care Instructions  Activity: Get plenty of rest for the remainder of the day. A responsible individual must stay with you for 24 hours following the procedure.  For the next 24 hours, DO NOT: -Drive a car -Paediatric nurse -Drink alcoholic beverages -Take any medication unless instructed by your physician -Make any legal decisions or sign important papers.  Meals: Start with liquid foods such as gelatin or soup. Progress to regular foods as tolerated. Avoid greasy, spicy, heavy foods. If nausea and/or vomiting occur, drink only clear liquids until the nausea and/or vomiting subsides. Call your physician if vomiting continues.  Special Instructions/Symptoms: Your throat may feel dry or sore from the anesthesia or the breathing tube placed in your throat during surgery. If this causes discomfort, gargle with warm salt water. The discomfort should disappear within 24 hours.  If you had a scopolamine patch placed behind your ear for the management of post- operative nausea and/or vomiting:  1. The medication in the patch is effective for 72 hours, after which it should be removed.  Wrap patch in a tissue and discard in the trash. Wash hands thoroughly with soap and water. 2. You may remove the patch earlier than 72 hours if you experience unpleasant side effects which may include dry mouth, dizziness or visual disturbances. 3. Avoid touching the patch. Wash your hands with soap and water after contact with the patch.    May take Tylenol after 6pm, if needed. May take Ibuprofen after 8pm, if needed.    Endometrial Ablation Endometrial ablation is a procedure that destroys the thin inner layer of the lining of the uterus (endometrium). This procedure may be done:  To stop heavy periods.  To stop bleeding that is causing anemia.  To control irregular bleeding.  To treat bleeding caused by small tumors (fibroids) in the endometrium. This procedure is often  an alternative to major surgery, such as removal of the uterus and cervix (hysterectomy). As a result of this procedure:  You may not be able to have children. However, if you are premenopausal (you have not gone through menopause): ? You may still have a small chance of getting pregnant. ? You will need to use a reliable method of birth control after the procedure to prevent pregnancy.  You may stop having a menstrual period, or you may have only a small amount of bleeding during your period. Menstruation may return several years after the procedure. Tell a health care provider about:  Any allergies you have.  All medicines you are taking, including vitamins, herbs, eye drops, creams, and over-the-counter medicines.  Any problems you or family members have had with the use of anesthetic medicines.  Any blood disorders you have.  Any surgeries you have had.  Any medical conditions you have. What are the risks? Generally, this is a safe procedure. However, problems may occur, including:  A hole (perforation) in the uterus or bowel.  Infection of the uterus, bladder, or vagina.  Bleeding.  Damage to other structures or organs.  An air bubble in the lung (air embolus).  Problems with pregnancy after the procedure.  Failure of the procedure.  Decreased ability to diagnose cancer in the endometrium. What happens after the procedure?  Your blood pressure, heart rate, breathing rate, and blood oxygen level will be monitored until the medicines you were given have worn off.  As tissue healing occurs, you may notice vaginal bleeding for 4-6 weeks after the procedure. You may  also experience: ? Cramps. ? Thin, watery vaginal discharge that is light pink or brown in color. ? A need to urinate more frequently than usual. ? Nausea.  Do not drive for 24 hours if you were given a sedative.  Do not have sex or insert anything into your vagina until your health care provider  approves. Summary  Endometrial ablation is done to treat the many causes of heavy menstrual bleeding.  The procedure may be done only after medications have been tried to control the bleeding.  Plan to have someone take you home from the hospital or clinic. This information is not intended to replace advice given to you by your health care provider. Make sure you discuss any questions you have with your health care provider. Document Revised: 12/13/2017 Document Reviewed: 07/15/2016 Elsevier Patient Education  [employer].

## 2020-04-17 ENCOUNTER — Encounter (HOSPITAL_BASED_OUTPATIENT_CLINIC_OR_DEPARTMENT_OTHER): Payer: Self-pay | Admitting: Obstetrics & Gynecology

## 2020-04-17 LAB — SURGICAL PATHOLOGY

## 2020-04-22 ENCOUNTER — Ambulatory Visit (AMBULATORY_SURGERY_CENTER): Payer: Managed Care, Other (non HMO) | Admitting: Gastroenterology

## 2020-04-22 ENCOUNTER — Other Ambulatory Visit: Payer: Self-pay

## 2020-04-22 ENCOUNTER — Encounter: Payer: Self-pay | Admitting: Gastroenterology

## 2020-04-22 ENCOUNTER — Other Ambulatory Visit: Payer: Self-pay | Admitting: *Deleted

## 2020-04-22 VITALS — BP 119/77 | HR 79 | Temp 98.2°F | Resp 12 | Ht 66.0 in | Wt 237.2 lb

## 2020-04-22 DIAGNOSIS — D125 Benign neoplasm of sigmoid colon: Secondary | ICD-10-CM

## 2020-04-22 DIAGNOSIS — K319 Disease of stomach and duodenum, unspecified: Secondary | ICD-10-CM

## 2020-04-22 DIAGNOSIS — Z1211 Encounter for screening for malignant neoplasm of colon: Secondary | ICD-10-CM | POA: Diagnosis present

## 2020-04-22 DIAGNOSIS — D122 Benign neoplasm of ascending colon: Secondary | ICD-10-CM

## 2020-04-22 DIAGNOSIS — K635 Polyp of colon: Secondary | ICD-10-CM | POA: Diagnosis not present

## 2020-04-22 DIAGNOSIS — R1310 Dysphagia, unspecified: Secondary | ICD-10-CM

## 2020-04-22 DIAGNOSIS — K2970 Gastritis, unspecified, without bleeding: Secondary | ICD-10-CM

## 2020-04-22 DIAGNOSIS — K208 Other esophagitis without bleeding: Secondary | ICD-10-CM | POA: Diagnosis not present

## 2020-04-22 DIAGNOSIS — D129 Benign neoplasm of anus and anal canal: Secondary | ICD-10-CM

## 2020-04-22 DIAGNOSIS — D124 Benign neoplasm of descending colon: Secondary | ICD-10-CM | POA: Diagnosis not present

## 2020-04-22 DIAGNOSIS — K3189 Other diseases of stomach and duodenum: Secondary | ICD-10-CM

## 2020-04-22 DIAGNOSIS — D128 Benign neoplasm of rectum: Secondary | ICD-10-CM | POA: Diagnosis not present

## 2020-04-22 DIAGNOSIS — K219 Gastro-esophageal reflux disease without esophagitis: Secondary | ICD-10-CM | POA: Diagnosis not present

## 2020-04-22 HISTORY — PX: COLONOSCOPY: SHX174

## 2020-04-22 HISTORY — PX: UPPER GASTROINTESTINAL ENDOSCOPY: SHX188

## 2020-04-22 MED ORDER — OMEPRAZOLE 40 MG PO CPDR: DELAYED_RELEASE_CAPSULE | ORAL | 0 refills | Status: DC

## 2020-04-22 MED ORDER — SODIUM CHLORIDE 0.9 % IV SOLN: 500.0000 mL | Freq: Once | INTRAVENOUS | Status: DC

## 2020-04-22 NOTE — Progress Notes (Signed)
Report to PACU, RN, vss, BBS= Clear.  

## 2020-04-22 NOTE — Op Note (Addendum)
[place] Patient Name: Monica Floyd Procedure Date: 04/22/2020 10:36 AM MRN: [id number] Endoscopist: Thornton Park MD, MD Age: 49 Referring MD:  Date of Birth: [birth year] Gender: Female Account #: [id number] Procedure:                Colonoscopy Indications:              Screening for colorectal malignant neoplasm, This                            is the patient's first colonoscopy                           No known family history of colon cancer or polyps Medicines:                Monitored Anesthesia Care Procedure:                Pre-Anesthesia Assessment:                           - Prior to the procedure, a History and Physical                            was performed, and patient medications and                            allergies were reviewed. The patient's tolerance of                            previous anesthesia was also reviewed. The risks                            and benefits of the procedure and the sedation                            options and risks were discussed with the patient.                            All questions were answered, and informed consent                            was obtained. Prior Anticoagulants: The patient has                            taken no previous anticoagulant or antiplatelet                            agents. ASA Grade Assessment: III - A patient with                            severe systemic disease. After reviewing the risks                            and benefits, the patient was deemed in  satisfactory condition to undergo the procedure.                           After obtaining informed consent, the colonoscope                            was passed under direct vision. Throughout the                            procedure, the patient's blood pressure, pulse, and                            oxygen saturations were monitored continuously. The                            Colonoscope was  introduced through the anus and                            advanced to the 3 cm into the ileum. The                            colonoscopy was performed with moderate difficulty                            due to a redundant colon, significant looping and a                            tortuous colon. Successful completion of the                            procedure was aided by applying abdominal pressure.                            The patient tolerated the procedure well. The                            quality of the bowel preparation was excellent. The                            terminal ileum, ileocecal valve, appendiceal                            orifice, and rectum were photographed. Scope In: 10:51:59 AM Scope Out: 11:10:42 AM Scope Withdrawal Time: 0 hours 13 minutes 31 seconds  Total Procedure Duration: 0 hours 18 minutes 43 seconds  Findings:                 The perianal and digital rectal examinations were                            normal.                           A 2 mm polyp was found in the rectum. The polyp was  sessile. The polyp was removed with a cold snare.                            Resection and retrieval were complete. Estimated                            blood loss was minimal.                           A 3 mm polyp was found in the sigmoid colon. The                            polyp was sessile. The polyp was removed with a                            cold snare. Resection and retrieval were complete.                            Estimated blood loss was minimal.                           Two sessile polyps were found in the descending                            colon. The polyps were 2 to 3 mm in size. These                            polyps were removed with a cold snare. Resection                            and retrieval were complete. Estimated blood loss                            was minimal.                           A less than 1 mm  polyp was found in the ileocecal                            valve. The polyp was flat. The polyp was removed                            with a cold biopsy forceps. Resection and retrieval                            were complete. Estimated blood loss was minimal.                           The exam was otherwise without abnormality on                            direct and retroflexion views. Complications:  No immediate complications. Estimated blood loss:                            Minimal. Estimated Blood Loss:     Estimated blood loss was minimal. Impression:               - One 2 mm polyp in the rectum, removed with a cold                            snare. Resected and retrieved.                           - One 3 mm polyp in the sigmoid colon, removed with                            a cold snare. Resected and retrieved.                           - Two 2 to 3 mm polyps in the descending colon,                            removed with a cold snare. Resected and retrieved.                           - One less than 1 mm polyp at the ileocecal valve,                            removed with a cold biopsy forceps. Resected and                            retrieved.                           - The examination was otherwise normal on direct                            and retroflexion views. Recommendation:           - Patient has a contact number available for                            emergencies. The signs and symptoms of potential                            delayed complications were discussed with the                            patient. Return to normal activities tomorrow.                            Written discharge instructions were provided to the                            patient.                           -  Resume previous diet.                           - Await pathology results.                           - Repeat colonoscopy date to be determined after                             pending pathology results are reviewed for                            surveillance.                           - Emerging evidence supports eating a diet of                            fruits, vegetables, grains, calcium, and yogurt                            while reducing red meat and alcohol may reduce the                            risk of colon cancer.                           - Thank you for allowing me to be involved in your                            colon cancer prevention. Thornton Park MD, MD 04/22/2020 11:22:10 AM This report has been signed electronically.

## 2020-04-22 NOTE — Patient Instructions (Signed)
YOU HAD AN ENDOSCOPIC PROCEDURE TODAY AT THE [facility]:   Refer to the procedure report that was given to you for any specific questions about what was found during the examination.  If the procedure report does not answer your questions, please call your gastroenterologist to clarify.  If you requested that your care partner not be given the details of your procedure findings, then the procedure report has been included in a sealed envelope for you to review at your convenience later.  YOU SHOULD EXPECT: Some feelings of bloating in the abdomen. Passage of more gas than usual.  Walking can help get rid of the air that was put into your GI tract during the procedure and reduce the bloating. If you had a lower endoscopy (such as a colonoscopy or flexible sigmoidoscopy) you may notice spotting of blood in your stool or on the toilet paper. If you underwent a bowel prep for your procedure, you may not have a normal bowel movement for a few days.  Please Note:  You might notice some irritation and congestion in your nose or some drainage.  This is from the oxygen used during your procedure.  There is no need for concern and it should clear up in a day or so.  SYMPTOMS TO REPORT IMMEDIATELY:   Following lower endoscopy (colonoscopy or flexible sigmoidoscopy):  Excessive amounts of blood in the stool  Significant tenderness or worsening of abdominal pains  Swelling of the abdomen that is new, acute  Fever of 100F or higher   Following upper endoscopy (EGD)  Vomiting of blood or coffee ground material  New chest pain or pain under the shoulder blades  Painful or persistently difficult swallowing  New shortness of breath  Fever of 100F or higher  Black, tarry-looking stools  For urgent or emergent issues, a gastroenterologist can be reached at any hour by calling [phone]. Do not use MyChart messaging for urgent concerns.    DIET:  We do recommend a small meal at first, but  then you may proceed to your regular diet.  Drink plenty of fluids but you should avoid alcoholic beverages for 24 hours.  ACTIVITY:  You should plan to take it easy for the rest of today and you should NOT DRIVE or use heavy machinery until tomorrow (because of the sedation medicines used during the test).    FOLLOW UP: Our staff will call the number listed on your records 48-72 hours following your procedure to check on you and address any questions or concerns that you may have regarding the information given to you following your procedure. If we do not reach you, we will leave a message.  We will attempt to reach you two times.  During this call, we will ask if you have developed any symptoms of COVID 19. If you develop any symptoms (ie: fever, flu-like symptoms, shortness of breath, cough etc.) before then, please call [phone].  If you test positive for Covid 19 in the 2 weeks post procedure, please call and report this information to us.    If any biopsies were taken you will be contacted by phone or by letter within the next 1-3 weeks.  Please call us at [phone] if you have not heard about the biopsies in 3 weeks.    SIGNATURES/CONFIDENTIALITY: You and/or your care partner have signed paperwork which will be entered into your electronic medical record.  These signatures attest to the fact that that the information above on   your After Visit Summary has been reviewed and is understood.  Full responsibility of the confidentiality of this discharge information lies with you and/or your care-partner. 

## 2020-04-22 NOTE — Op Note (Addendum)
[place] Patient Name: Monica Floyd Procedure Date: 04/22/2020 10:37 AM MRN: [id number] Endoscopist: Thornton Park MD, MD Age: 49 Referring MD:  Date of Birth: 1971-03-03 Gender: Female Account #: [id number] Procedure:                Upper GI endoscopy Indications:              Dysphagia, Suspected esophageal reflux Medicines:                Monitored Anesthesia Care Procedure:                Pre-Anesthesia Assessment:                           - Prior to the procedure, a History and Physical                            was performed, and patient medications and                            allergies were reviewed. The patient's tolerance of                            previous anesthesia was also reviewed. The risks                            and benefits of the procedure and the sedation                            options and risks were discussed with the patient.                            All questions were answered, and informed consent                            was obtained. Prior Anticoagulants: The patient has                            taken no previous anticoagulant or antiplatelet                            agents. ASA Grade Assessment: II - A patient with                            mild systemic disease. After reviewing the risks                            and benefits, the patient was deemed in                            satisfactory condition to undergo the procedure.                           After obtaining informed consent, the endoscope was  passed under direct vision. Throughout the                            procedure, the patient's blood pressure, pulse, and                            oxygen saturations were monitored continuously. The                            Endoscope was introduced through the mouth, and                            advanced to the third part of duodenum. The upper                            GI endoscopy  was accomplished without difficulty.                            The patient tolerated the procedure well. Scope In: Scope Out: Findings:                 The examined esophagus was normal. Biopsies were                            obtained from the proximal and distal esophagus                            with cold forceps for histology.                           Moderate inflammation characterized by linear                            erosions was found in the gastric body. Biopsies                            were taken from the antrum, body, and fundus with a                            cold forceps for histology. Estimated blood loss                            was minimal.                           The examined duodenum was normal.                           The cardia and gastric fundus were normal on                            retroflexion.                           The exam was otherwise without abnormality. Complications:  No immediate complications. Estimated blood loss:                            Minimal. Estimated Blood Loss:     Estimated blood loss was minimal. Impression:               - Normal esophagus. Biopsied.                           - Gastritis. Biopsied.                           - Normal examined duodenum.                           - The examination was otherwise normal. Recommendation:           - Patient has a contact number available for                            emergencies. The signs and symptoms of potential                            delayed complications were discussed with the                            patient. Return to normal activities tomorrow.                            Written discharge instructions were provided to the                            patient.                           - Resume previous diet.                           - Continue present medications. Increase omeprazole                            to 40 mg twice daily for at least 8  weeks.                           - Await pathology results.                           - No aspirin, ibuprofen, naproxen, or other                            non-steroidal anti-inflammatory drugs.                           - Proceed with colonoscopy as previously scheduled. Thornton Park MD, MD 04/22/2020 11:17:00 AM This report has been signed electronically.

## 2020-04-22 NOTE — Progress Notes (Signed)
Pt's states had ablation October 6 otherwise, no medical or surgical changes since previsit or office visit.  Pt states no longer on eliquis.  Vitals Gabi

## 2020-04-23 ENCOUNTER — Encounter: Payer: Self-pay | Admitting: *Deleted

## 2020-04-23 ENCOUNTER — Telehealth: Payer: Self-pay | Admitting: *Deleted

## 2020-04-23 NOTE — Telephone Encounter (Signed)
Monica Floyd Key: [id number] - PA Case ID: [id number] Need help? Call us at [phone]  Outcome Approvedtoday  Request Reference Number: [id number].  OMEPRAZOLE CAP 40MG  is approved through 04/23/2021.   Your patient may now fill this prescription and it will be covered.  Drug Omeprazole 40MG  dr capsules Form [employer] Electronic Prior Authorization Form [phone] NCPDP)

## 2020-04-24 ENCOUNTER — Telehealth: Payer: Self-pay | Admitting: *Deleted

## 2020-04-24 ENCOUNTER — Telehealth: Payer: Self-pay

## 2020-04-24 NOTE — Telephone Encounter (Signed)
  Follow up Call-Left message on follow up call.   ?

## 2020-04-24 NOTE — Telephone Encounter (Signed)
°  Follow up Call-  Call back number 04/22/2020  Post procedure Call Back phone  # [phone]  Permission to leave phone message Yes  Some recent data might be hidden     Patient questions:  Do you have a fever, pain , or abdominal swelling? No. Pain Score  0 *  Have you tolerated food without any problems? Yes.    Have you been able to return to your normal activities? Yes.    Do you have any questions about your discharge instructions: Diet   No. Medications  No. Follow up visit  No.  Do you have questions or concerns about your Care? No.  Actions: * If pain score is 4 or above: No action needed, pain <4.  1. Have you developed a fever since your procedure? no  2.   Have you had an respiratory symptoms (SOB or cough) since your procedure? no  3.   Have you tested positive for COVID 19 since your procedure no  4.   Have you had any family members/close contacts diagnosed with the COVID 19 since your procedure?  no   If yes to any of these questions please route to Joylene John, RN and Joella Prince, RN

## 2020-04-27 ENCOUNTER — Encounter: Payer: Self-pay | Admitting: Gastroenterology

## 2020-04-28 ENCOUNTER — Telehealth: Payer: Self-pay

## 2020-04-28 NOTE — Telephone Encounter (Signed)
Left message for patient to please call back. 

## 2020-04-28 NOTE — Telephone Encounter (Signed)
-----   Message from Thornton Park, MD sent at 04/27/2020  4:17 PM EDT ----- Office follow-up in 8-10 weeks. Thank you.

## 2020-05-06 ENCOUNTER — Encounter: Payer: Managed Care, Other (non HMO) | Admitting: Obstetrics & Gynecology

## 2020-05-12 ENCOUNTER — Encounter: Payer: Managed Care, Other (non HMO) | Admitting: Obstetrics & Gynecology

## 2020-05-12 ENCOUNTER — Encounter: Payer: Self-pay | Admitting: Obstetrics & Gynecology

## 2020-05-12 ENCOUNTER — Telehealth: Payer: Self-pay | Admitting: Gastroenterology

## 2020-05-12 ENCOUNTER — Ambulatory Visit (INDEPENDENT_AMBULATORY_CARE_PROVIDER_SITE_OTHER): Payer: Managed Care, Other (non HMO) | Admitting: Obstetrics & Gynecology

## 2020-05-12 ENCOUNTER — Other Ambulatory Visit: Payer: Self-pay

## 2020-05-12 VITALS — BP 114/90 | HR 86 | Wt 238.2 lb

## 2020-05-12 DIAGNOSIS — Z9889 Other specified postprocedural states: Secondary | ICD-10-CM

## 2020-05-12 DIAGNOSIS — Z09 Encounter for follow-up examination after completed treatment for conditions other than malignant neoplasm: Secondary | ICD-10-CM

## 2020-05-12 MED ORDER — OMEPRAZOLE 40 MG PO CPDR: 40.0000 mg | DELAYED_RELEASE_CAPSULE | Freq: Two times a day (BID) | ORAL | 0 refills | Status: DC

## 2020-05-12 NOTE — Telephone Encounter (Signed)
Rx sent to pharmacy for 12 week supply.

## 2020-05-12 NOTE — Progress Notes (Signed)
    Postoperative Note  Subjective:     Monica Floyd is a 49 y.o. female who presents to the clinic 4 weeks status post Hysteroscopy, Dilation and Curettage,  Hydrothermal Endometrial Ablation for abnormal uterine bleeding. Eating a regular diet without difficulty. Bowel movements are normal. The patient is not having any pain. Reports some night sweats and hot flashes, wants to know how to treat them.  The following portions of the patient's history were reviewed and updated as appropriate: allergies, current medications, past family history, past medical history, past social history, past surgical history and problem list.  Review of Systems A comprehensive review of systems was negative.    Objective:    BP 114/90   Pulse 86   Wt 238 lb 3.2 oz (108 kg)   BMI 38.45 kg/m  General:  alert and no distress  Abdomen: soft, bowel sounds active, non-tender  Pelvic:  deferred   04/16/20 Surgical Pathology ENDOMETRIUM, CURETTAGE  - Benign inactive endometrium  - Scant benign cervical squamous mucosa  - Negative for hyperplasia or malignancy    Assessment:    Doing well postoperatively. Operative findings again reviewed. Pathology report discussed.    Plan:     1. Continue any current medications. 2. Discussed vasomotor symptoms.  Recommended lifestyle interventions such as wearing light clothing, remaining in cool environments, having fan/air conditioner in the room, avoiding hot beverages etc.  Discussed using hormone therapy and concerns about increased risk of heart disease, cerebrovascular disease, thromboembolic disease,  and breast cancer.  Also discussed other medical options such as Paxil, Effexor, Brisdelle, Clonidine,  or Neurontin.   Also discussed alternative therapies such as herbal remedies but cautioned that most of the products contained phytoestrogens (plant estrogens) in unregulated amounts which can have the same effects on the body as the pharmaceutical estrogen  preparations.  Also referred her to [email or web address].org for other alternative options.  She will consider these options and let us know if she decides on any of them. 3. Activity restrictions: none 4. Anticipated return to work: not applicable. 5. Follow up as needed.     Verita Schneiders, MD, [place] for [employer], [place]

## 2020-05-12 NOTE — Telephone Encounter (Signed)
Pt called to inform that her insurance only covers Omeprazale 1 tablet BID for 12 weeks instead of 8 weeks. Pt is asking if prescription can be changed to 12 weeks and sent to her pharmacy.   Dottie could you pls help pt with this or route this message to the correct person? I was not able to find the [place] who worked on this. Thank you.

## 2020-05-12 NOTE — Patient Instructions (Addendum)
Return to clinic for any scheduled appointments or for any gynecologic concerns as needed.   Discussed lifestyle interventions such as wearing light clothing, remaining in cool environments, having fan/air conditioner in the room, avoiding hot beverages etc.  Discussed using hormone therapy and concerns about increased risk of heart disease, cerebrovascular disease, thromboembolic disease,  and breast cancer.  Also discussed other medical options such as Paxil, Effexor, Brisdelle, Clonidine,  or Neurontin.   Also discussed alternative therapies such as herbal remedies but cautioned that most of the products contained phytoestrogens (plant estrogens) in unregulated amounts which can have the same effects on the body as the pharmaceutical estrogen preparations.  Also referred her to [email or web address].org for other alternative options.

## 2020-06-26 ENCOUNTER — Encounter: Payer: Self-pay | Admitting: Gastroenterology

## 2020-06-26 ENCOUNTER — Ambulatory Visit: Payer: Managed Care, Other (non HMO) | Admitting: Gastroenterology

## 2020-06-26 VITALS — BP 118/80 | HR 97 | Ht 66.0 in | Wt 235.0 lb

## 2020-06-26 DIAGNOSIS — K219 Gastro-esophageal reflux disease without esophagitis: Secondary | ICD-10-CM | POA: Diagnosis not present

## 2020-06-26 DIAGNOSIS — R1310 Dysphagia, unspecified: Secondary | ICD-10-CM

## 2020-06-26 MED ORDER — OMEPRAZOLE 40 MG PO CPDR: 40.0000 mg | DELAYED_RELEASE_CAPSULE | Freq: Two times a day (BID) | ORAL | 3 refills | Status: AC

## 2020-06-26 NOTE — Progress Notes (Signed)
Referring Provider: Jearld Fenton, NP Primary Care Physician:  Monica Fenton, NP  Chief complaint:  GERD and dysphagia   IMPRESSION:  Reflux improving on PPI therapy Dysphagia to solids and liquids H. pylori negative gastritis History of colon polyps    -2 tubular adenomas removed on colonoscopy 04/22/2020  Reflux: Improving on PPI BID therapy, with recurrence of symptoms after missing a single dose of treatment. No plans to reduce dose at this time.  Dysphagia to solids and liquids: EGD showed reflux with no esophagitis. Biopsies negative for EOE. Will proceed with further evaluation given her persistent symptoms despite PPI BID therapy.   History of colon polyps: Reviewed recent colonoscopy findings and associated pathology report.  Surveillance colonoscopy recommended in 7 years.  PLAN: Continue omeprazole 40 mg BID (3 months with 3 refills) Barium esophagram If negative, proceed with esophageal manometry and 24 impedence/pH testing Surveillance colonoscopy 2028  Please see the "Patient Instructions" section for addition details about the plan.  HPI: Monica Floyd is a 49 y.o. female who returns in scheduled follow-up.  She was initially seen in the office by Monica Floyd 11/23/2019 for longstanding history of reflux despite omeprazole.  Upper endoscopy 04/22/2020 showed reflux and H. pylori negative gastritis. Biopsies  Screening colonoscopy 04/22/2020 revealed 5 small polyps, 2 of which were tubular adenomas.  There was a hyperplastic polyp and polypoid appearing mucosa.  Omeprazole increased to 40 mg BID. If she misses one dose, she notes reflux, brash, and nausea. Ongoing, intermittent dysphagia to solids and liquids. No difficulty initiating the swallow. Some associated choking particularly with liquids. Has to regurgitate the food because the bolus will not pass. No odynophagia, neck pain, sore throat, dysphonia.  No blood in the stool or change in bowel habits.     Unintentional weight gain since Covid and follow-up DVT/PE.  Attributes this to poor stamina with exercise.     Past Medical History:  Diagnosis Date  . Abnormal uterine bleeding (AUB) 04/16/2020  . Allergy   . Anxiety   . DVT (deep venous thrombosis) ([place])   . Gastritis   . GERD (gastroesophageal reflux disease)   . Pulmonary embolism ([place]) 2021  . Seasonal allergies     Past Surgical History:  Procedure Laterality Date  . COLONOSCOPY  04/22/2020  . DILITATION & CURRETTAGE/HYSTROSCOPY WITH HYDROTHERMAL ABLATION N/A 04/16/2020   Procedure: DILATATION & CURETTAGE/HYSTEROSCOPY WITH HYDROTHERMAL ENDOMETRIAL ABLATION;  Surgeon: Osborne Oman, MD;  Location: [facility];  Service: Gynecology;  Laterality: N/A;  . KNEE ARTHROSCOPY W/ ACL RECONSTRUCTION Right 2012  . UPPER GASTROINTESTINAL ENDOSCOPY  04/22/2020  . WISDOM TOOTH EXTRACTION      Current Outpatient Medications  Medication Sig Dispense Refill  . cetirizine (ZYRTEC) 10 MG tablet Take 10 mg by mouth daily.    . hydrOXYzine (ATARAX/VISTARIL) 10 MG tablet TAKE 1 TABLET BY MOUTH EVERY DAY AS NEEDED 30 tablet 0  . omeprazole (PRILOSEC) 40 MG capsule Take 1 capsule (40 mg total) by mouth in the morning and at bedtime. X 8-12 WEEKS.. pharmacy-please d/c previous omeprazole rx 180 capsule 0   No current facility-administered medications for this visit.    Allergies as of 06/26/2020  . (No Known Allergies)    Family History  Problem Relation Age of Onset  . Breast cancer Mother   . Pernicious anemia Mother   . Cancer Father        prostate/bladder  . COPD Father   . Breast cancer Maternal Grandmother  55  . Alzheimer's disease Maternal Grandmother   . Breast cancer Maternal Aunt 68  . Colon cancer Neg Hx   . Rectal cancer Neg Hx   . Esophageal cancer Neg Hx   . Stomach cancer Neg Hx   . AAA (abdominal aortic aneurysm) Neg Hx     Social History   Socioeconomic History  . Marital status: Married     Spouse name: Not on file  . Number of children: Not on file  . Years of education: Not on file  . Highest education level: Not on file  Occupational History  . Not on file  Tobacco Use  . Smoking status: Never Smoker  . Smokeless tobacco: Never Used  Vaping Use  . Vaping Use: Never used  Substance and Sexual Activity  . Alcohol use: Yes    Alcohol/week: 4.0 - 5.0 standard drinks    Types: 4 - 5 Standard drinks or equivalent per week    Comment: occ  . Drug use: No  . Sexual activity: Yes    Partners: Male    Birth control/protection: Surgical    Comment: husband-vasectomy  Other Topics Concern  . Not on file  Social History Narrative  . Not on file   Social Determinants of Health   Financial Resource Strain: Not on file  Food Insecurity: Not on file  Transportation Needs: Not on file  Physical Activity: Not on file  Stress: Not on file  Social Connections: Not on file  Intimate Partner Violence: Not on file    Review of Systems: 12 system ROS is negative except as noted above.   Physical Exam: General:   Alert,  well-nourished, pleasant and cooperative in NAD Head:  Normocephalic and atraumatic. Eyes:  Sclera clear, no icterus.   Conjunctiva pink. Neck: No thyromegaly, tracheal deviation, palpable lymph nodes Abdomen:  Soft, nontender, nondistended, normal bowel sounds, no rebound or guarding. No hepatosplenomegaly.   Rectal:  Deferred  Msk:  Symmetrical. No boney deformities LAD: No inguinal or umbilical LAD Extremities:  No clubbing or edema. Neurologic:  Alert and  oriented x4;  grossly nonfocal Skin:  Intact without significant lesions or rashes. Psych:  Alert and cooperative. Normal mood and affect.    Monica Floyd L. Tarri Glenn, MD, MPH 06/26/2020, 9:19 AM

## 2020-06-26 NOTE — Patient Instructions (Addendum)
Continue to take omeprazole 40 mg twice daily.  We will further evaluate your swallowing trouble with a barium esophagram. If that is normal, the next test would be an esophageal manometry to evaluate muscle function.   You have been scheduled for a Barium Esophogram at [facility] (1st floor of the hospital) on 07/02/20 at 10am. Please arrive 15 minutes prior to your appointment for registration. Make certain not to have anything to eat or drink 3 hours prior to your test. If you need to reschedule for any reason, please contact radiology at [phone] to do so. __________________________________________________________________ A barium swallow is an examination that concentrates on views of the esophagus. This tends to be a double contrast exam (barium and two liquids which, when combined, create a gas to distend the wall of the oesophagus) or single contrast (non-ionic iodine based). The study is usually tailored to your symptoms so a good history is essential. Attention is paid during the study to the form, structure and configuration of the esophagus, looking for functional disorders (such as aspiration, dysphagia, achalasia, motility and reflux) EXAMINATION You may be asked to change into a gown, depending on the type of swallow being performed. A radiologist and radiographer will perform the procedure. The radiologist will advise you of the type of contrast selected for your procedure and direct you during the exam. You will be asked to stand, sit or lie in several different positions and to hold a small amount of fluid in your mouth before being asked to swallow while the imaging is performed .In some instances you may be asked to swallow barium coated marshmallows to assess the motility of a solid food bolus. The exam can be recorded as a digital or video fluoroscopy procedure. POST PROCEDURE It will take 1-2 days for the barium to pass through your system. To facilitate this, it is  important, unless otherwise directed, to increase your fluids for the next 24-48hrs and to resume your normal diet.  This test typically takes about 30 minutes to perform. ___________________________________________________________

## 2020-07-02 ENCOUNTER — Other Ambulatory Visit: Payer: Self-pay

## 2020-07-02 ENCOUNTER — Ambulatory Visit (HOSPITAL_COMMUNITY)
Admission: RE | Admit: 2020-07-02 | Discharge: 2020-07-02 | Disposition: A | Discharge status: Home / Self Care | Payer: Managed Care, Other (non HMO) | Source: Ambulatory Visit | Attending: Gastroenterology | Admitting: Gastroenterology

## 2020-07-02 DIAGNOSIS — R1310 Dysphagia, unspecified: Secondary | ICD-10-CM | POA: Diagnosis not present

## 2020-10-06 ENCOUNTER — Encounter: Payer: Self-pay | Admitting: Radiology

## 2021-04-08 ENCOUNTER — Encounter: Payer: Self-pay | Admitting: Internal Medicine

## 2021-06-29 ENCOUNTER — Other Ambulatory Visit: Payer: Self-pay | Admitting: Gastroenterology

## 2021-08-14 ENCOUNTER — Ambulatory Visit (INDEPENDENT_AMBULATORY_CARE_PROVIDER_SITE_OTHER): Payer: Managed Care, Other (non HMO)

## 2021-08-14 ENCOUNTER — Ambulatory Visit: Payer: Managed Care, Other (non HMO) | Admitting: Podiatry

## 2021-08-14 ENCOUNTER — Other Ambulatory Visit: Payer: Self-pay

## 2021-08-14 ENCOUNTER — Encounter: Payer: Self-pay | Admitting: Podiatry

## 2021-08-14 DIAGNOSIS — M722 Plantar fascial fibromatosis: Secondary | ICD-10-CM | POA: Diagnosis not present

## 2021-08-14 MED ORDER — BETAMETHASONE SOD PHOS & ACET 6 (3-3) MG/ML IJ SUSP: 3.0000 mg | Freq: Once | INTRAMUSCULAR | Status: DC

## 2021-08-14 MED ORDER — MELOXICAM 15 MG PO TABS: 15.0000 mg | ORAL_TABLET | Freq: Every day | ORAL | 1 refills | Status: DC

## 2021-08-14 MED ORDER — METHYLPREDNISOLONE 4 MG PO TBPK: ORAL_TABLET | ORAL | 0 refills | Status: DC

## 2021-08-14 NOTE — Progress Notes (Signed)
° °  Subjective: 51 y.o. female presenting today for evaluation of bilateral heel pain this been going on for few months now.  Patient states that it may have began when she began exercising and walking for exercise.  She denies a history of injury.  She noticed an increase in heel pain.  She is hoping to have something aggressive done today to help with her heel pain because she is going to [employer] this evening and will be walking several miles a day at [employer].  She has not done anything for treatment at the moment   Past Medical History:  Diagnosis Date   Abnormal uterine bleeding (AUB) 04/16/2020   Allergy    Anxiety    DVT (deep venous thrombosis) ([facility])    Gastritis    GERD (gastroesophageal reflux disease)    Pulmonary embolism ([place]) 2021   Seasonal allergies      Objective: Physical Exam General: The patient is alert and oriented x3 in no acute distress.  Dermatology: Skin is warm, dry and supple bilateral lower extremities. Negative for open lesions or macerations bilateral.   Vascular: Dorsalis Pedis and Posterior Tibial pulses palpable bilateral.  Capillary fill time is immediate to all digits.  Neurological: Epicritic and protective threshold intact bilateral.   Musculoskeletal: Tenderness to palpation to the plantar aspect of the bilateral heels along the plantar fascia. All other joints range of motion within normal limits bilateral. Strength 5/5 in all groups bilateral.   Radiographic exam: Normal osseous mineralization. Joint spaces preserved. No fracture/dislocation/boney destruction. No other soft tissue abnormalities or radiopaque foreign bodies.  Plantar heel spur noted on lateral view  Assessment: 1. plantar fasciitis bilateral feet  Plan of Care:  1. Patient evaluated. Xrays reviewed.   2. Injection of 0.5cc Celestone soluspan injected into the bilateral heels.  3. Rx for Medrol Dose Pak placed 4. Rx for Meloxicam ordered for patient. 5. Plantar fascial  band(s) dispensed for bilateral plantar fasciitis. 6. Instructed patient regarding therapies and modalities at home to alleviate symptoms.  Continue wearing good supportive shoes and sneakers 7. Return to clinic in 4 weeks.    *Works for [employer] working at home.  Going to [place] this evening to celebrate her son's 27th birthday.  Son lives in [place], [place] [facility]  Dr. Edrick Kins, DPM    [address], [place] [address]                Office [phone]  Fax [phone]

## 2021-09-11 ENCOUNTER — Other Ambulatory Visit: Payer: Self-pay

## 2021-09-11 ENCOUNTER — Ambulatory Visit: Payer: Managed Care, Other (non HMO) | Admitting: Podiatry

## 2021-09-11 DIAGNOSIS — M722 Plantar fascial fibromatosis: Secondary | ICD-10-CM | POA: Diagnosis not present

## 2021-09-11 MED ORDER — BETAMETHASONE SOD PHOS & ACET 6 (3-3) MG/ML IJ SUSP
3.0000 mg | Freq: Once | INTRAMUSCULAR | Status: AC
  Administered 2021-09-11: 3 mg via INTRA_ARTICULAR

## 2021-09-11 NOTE — Progress Notes (Signed)
? ?  Subjective: ?51 y.o. female presenting today for follow-up evaluation of plantar fasciitis to the bilateral feet.  She says she was doing really well up until about yesterday when she began to experience increased pain and tenderness especially to the left heel.  She completed the Medrol Dosepak and has been taking the meloxicam.  She also says that the plantar fascial brace has helped.  She presents for further treatment and evaluation.  No new complaints at this time ? ? ?Past Medical History:  ?Diagnosis Date  ? Abnormal uterine bleeding (AUB) 04/16/2020  ? Allergy   ? Anxiety   ? DVT (deep venous thrombosis) ([place])   ? Gastritis   ? GERD (gastroesophageal reflux disease)   ? Pulmonary embolism ([place]) 2021  ? Seasonal allergies   ? ? ? ?Objective: ?Physical Exam ?General: The patient is alert and oriented x3 in no acute distress. ? ?Dermatology: Skin is warm, dry and supple bilateral lower extremities. Negative for open lesions or macerations bilateral.  ? ?Vascular: Dorsalis Pedis and Posterior Tibial pulses palpable bilateral.  Capillary fill time is immediate to all digits. ? ?Neurological: Epicritic and protective threshold intact bilateral.  ? ?Musculoskeletal: There continues to be some residual tenderness to palpation to the plantar aspect of the bilateral heels along the plantar fascia. All other joints range of motion within normal limits bilateral. Strength 5/5 in all groups bilateral.  ? ?Assessment: ?1. plantar fasciitis bilateral feet ? ?Plan of Care:  ?1. Patient evaluated. Xrays reviewed.   ?2. Injection of 0.5cc Celestone soluspan injected into the bilateral heels.  ?3.  Continue meloxicam 15 mg daily as needed ?4.  Continue plantar fascial brace daily as needed ?5.  Continue wearing good supportive shoes and sneakers. ?6.  Return to clinic as needed ? ?*Works for [employer] working at home.   ? ?Edrick Kins, DPM ?[place] ? ?Dr. Edrick Kins, DPM  ?  ?[address]                                   ?[place], [place] [address]                ?Office [phone]  ?Fax [phone] ? ? ? ? ?

## 2022-02-06 ENCOUNTER — Emergency Department
Admission: EM | Admit: 2022-02-06 | Discharge: 2022-02-06 | Disposition: A | Discharge status: Home / Self Care | Payer: Managed Care, Other (non HMO) | Attending: Emergency Medicine | Admitting: Emergency Medicine

## 2022-02-06 ENCOUNTER — Other Ambulatory Visit: Payer: Self-pay

## 2022-02-06 ENCOUNTER — Emergency Department: Payer: Managed Care, Other (non HMO)

## 2022-02-06 DIAGNOSIS — R112 Nausea with vomiting, unspecified: Secondary | ICD-10-CM

## 2022-02-06 DIAGNOSIS — R1110 Vomiting, unspecified: Secondary | ICD-10-CM | POA: Diagnosis not present

## 2022-02-06 DIAGNOSIS — R0602 Shortness of breath: Secondary | ICD-10-CM | POA: Diagnosis not present

## 2022-02-06 DIAGNOSIS — N179 Acute kidney failure, unspecified: Secondary | ICD-10-CM | POA: Insufficient documentation

## 2022-02-06 DIAGNOSIS — H538 Other visual disturbances: Secondary | ICD-10-CM | POA: Diagnosis not present

## 2022-02-06 DIAGNOSIS — R0789 Other chest pain: Secondary | ICD-10-CM | POA: Diagnosis present

## 2022-02-06 DIAGNOSIS — R079 Chest pain, unspecified: Secondary | ICD-10-CM

## 2022-02-06 LAB — HEPATIC FUNCTION PANEL
ALT: 24 U/L (ref 0–44)
AST: 20 U/L (ref 15–41)
Albumin: 4.5 g/dL (ref 3.5–5.0)
Alkaline Phosphatase: 49 U/L (ref 38–126)
Bilirubin, Direct: 0.1 mg/dL (ref 0.0–0.2)
Indirect Bilirubin: 1 mg/dL — ABNORMAL HIGH (ref 0.3–0.9)
Total Bilirubin: 1.1 mg/dL (ref 0.3–1.2)
Total Protein: 7.5 g/dL (ref 6.5–8.1)

## 2022-02-06 LAB — BASIC METABOLIC PANEL
Anion gap: 13 (ref 5–15)
BUN: 18 mg/dL (ref 6–20)
CO2: 19 mmol/L — ABNORMAL LOW (ref 22–32)
Calcium: 10.1 mg/dL (ref 8.9–10.3)
Chloride: 105 mmol/L (ref 98–111)
Creatinine, Ser: 1.74 mg/dL — ABNORMAL HIGH (ref 0.44–1.00)
GFR, Estimated: 35 mL/min — ABNORMAL LOW (ref >60)
Glucose, Bld: 120 mg/dL — ABNORMAL HIGH (ref 70–99)
Potassium: 3.8 mmol/L (ref 3.5–5.1)
Sodium: 137 mmol/L (ref 135–145)

## 2022-02-06 LAB — CBC
HCT: 43.9 % (ref 36.0–46.0)
Hemoglobin: 14.7 g/dL (ref 12.0–15.0)
MCH: 29.8 pg (ref 26.0–34.0)
MCHC: 33.5 g/dL (ref 30.0–36.0)
MCV: 88.9 fL (ref 80.0–100.0)
Platelets: 312 10*3/uL (ref 150–400)
RBC: 4.94 MIL/uL (ref 3.87–5.11)
RDW: 12.6 % (ref 11.5–15.5)
WBC: 9.9 10*3/uL (ref 4.0–10.5)
nRBC: 0 % (ref 0.0–0.2)

## 2022-02-06 LAB — TROPONIN I (HIGH SENSITIVITY)
Troponin I (High Sensitivity): <2 ng/L (ref <18)
Troponin I (High Sensitivity): 2 ng/L (ref <18)

## 2022-02-06 LAB — LIPASE, BLOOD: Lipase: 40 U/L (ref 11–51)

## 2022-02-06 MED ORDER — ALUM & MAG HYDROXIDE-SIMETH 200-200-20 MG/5ML PO SUSP
30.0000 mL | Freq: Once | ORAL | Status: AC
  Administered 2022-02-06: 30 mL via ORAL
  Filled 2022-02-06: qty 30

## 2022-02-06 MED ORDER — IOHEXOL 350 MG/ML SOLN
75.0000 mL | Freq: Once | INTRAVENOUS | Status: AC | PRN
  Administered 2022-02-06: 75 mL via INTRAVENOUS

## 2022-02-06 MED ORDER — OMEPRAZOLE MAGNESIUM 20 MG PO TBEC: 20.0000 mg | DELAYED_RELEASE_TABLET | Freq: Every day | ORAL | 1 refills | Status: DC

## 2022-02-06 MED ORDER — ONDANSETRON HCL 4 MG/2ML IJ SOLN
4.0000 mg | Freq: Once | INTRAMUSCULAR | Status: AC
  Administered 2022-02-06: 4 mg via INTRAVENOUS
  Filled 2022-02-06: qty 2

## 2022-02-06 MED ORDER — LACTATED RINGERS IV BOLUS
2000.0000 mL | Freq: Once | INTRAVENOUS | Status: AC
  Administered 2022-02-06: 2000 mL via INTRAVENOUS

## 2022-02-06 MED ORDER — LIDOCAINE VISCOUS HCL 2 % MT SOLN
15.0000 mL | Freq: Once | OROMUCOSAL | Status: AC
  Administered 2022-02-06: 15 mL via ORAL
  Filled 2022-02-06: qty 15

## 2022-02-06 MED ORDER — KETOROLAC TROMETHAMINE 30 MG/ML IJ SOLN
15.0000 mg | Freq: Once | INTRAMUSCULAR | Status: AC
Start: 2022-02-06 — End: 2022-02-06
  Administered 2022-02-06: 15 mg via INTRAVENOUS
  Filled 2022-02-06: qty 1

## 2022-02-06 MED ORDER — MORPHINE SULFATE (PF) 4 MG/ML IV SOLN
4.0000 mg | Freq: Once | INTRAVENOUS | Status: AC
  Administered 2022-02-06: 4 mg via INTRAVENOUS
  Filled 2022-02-06: qty 1

## 2022-02-06 MED ORDER — SUCRALFATE 1 G PO TABS: 1.0000 g | ORAL_TABLET | Freq: Four times a day (QID) | ORAL | 0 refills | Status: DC

## 2022-02-06 NOTE — ED Notes (Signed)
Pt. Up to bathroom indep. Back to stretcher, Lights dimmed, denies further need at this time.

## 2022-02-06 NOTE — ED Provider Notes (Addendum)
[facility] Provider Note    Event Date/Time   First MD Initiated Contact with Patient 02/06/22 1427     (approximate)   History   Chest Pain   HPI  Monica Floyd is a 51 y.o. female with past no history of GERD, PE and DVT, gastritis, abnormal uterine bleeding and seasonal allergies who presents for evaluation of fairly sudden onset of some right-sided chest pain rating to the back and shoulder associated with shortness of breath and some nausea and vomiting.  Patient also states her vision is little blurry although she does not wear glasses.  No fevers, cough, abdominal pain, urinary symptoms, diarrhea, constipation, headache or earache or sore throat.  No rashes.  Patient denies any tobacco abuse, EtOH use or illicit drug use.  He states he was diagnosed with a PE in 2021 and is not currently on any blood thinners.  Denies any heavy lifting injury or falls.    Past Medical History:  Diagnosis Date   Abnormal uterine bleeding (AUB) 04/16/2020   Allergy    Anxiety    DVT (deep venous thrombosis) ([facility])    Gastritis    GERD (gastroesophageal reflux disease)    Pulmonary embolism ([facility]) 2021   Seasonal allergies      Physical Exam  Triage Vital Signs: ED Triage Vitals  Enc Vitals Group     BP 02/06/22 1427 120/88     Pulse Rate 02/06/22 1427 90     Resp 02/06/22 1427 (!) 24     Temp 02/06/22 1427 (!) 97.5 F (36.4 C)     Temp Source 02/06/22 1427 Oral     SpO2 02/06/22 1427 100 %     Weight 02/06/22 1422 240 lb (108.9 kg)     Height 02/06/22 1422 '5\' 6"'$  (1.676 m)     Head Circumference --      Peak Flow --      Pain Score 02/06/22 1422 4     Pain Loc --      Pain Edu? --      Excl. in [place]? --     Most recent vital signs: Vitals:   02/06/22 1427 02/06/22 1440  BP: 120/88 (!) 140/105  Pulse: 90 87  Resp: (!) 24 (!) 21  Temp: (!) 97.5 F (36.4 C)   SpO2: 100% 100%    General: Awake, patient does appear very uncomfortable. CV:  Good  peripheral perfusion.  2+ radial pulses. Resp:  Normal effort.  No significant wheezing rhonchi rales.  Clear.  Slightly tachypneic. Abd:  No distention.  Soft throughout. Other:  No significant lower extremity edema.  Mild tenderness over the right scapular area.   ED Results / Procedures / Treatments  Labs (all labs ordered are listed, but only abnormal results are displayed) Labs Reviewed  BASIC METABOLIC PANEL - Abnormal; Notable for the following components:      Result Value   CO2 19 (*)    Glucose, Bld 120 (*)    Creatinine, Ser 1.74 (*)    GFR, Estimated 35 (*)    All other components within normal limits  HEPATIC FUNCTION PANEL - Abnormal; Notable for the following components:   Indirect Bilirubin 1.0 (*)    All other components within normal limits  CBC  LIPASE, BLOOD  PROTIME-INR  APTT  TROPONIN I (HIGH SENSITIVITY)     EKG  EKG is remarkable for sinus rhythm with a ventricular rate of 91, normal axis, unremarkable intervals without  clear evidence of acute ischemia or significant arrhythmia.   RADIOLOGY Chest reviewed by myself shows no focal consoidation, effusion, edema, pneumothorax or other clear acute thoracic process. I also reviewed radiology interpretation and agree with findings described.    PROCEDURES:  Critical Care performed: No  .1-3 Lead EKG Interpretation  Performed by: Lucrezia Starch, MD Authorized by: Lucrezia Starch, MD     Interpretation: normal     ECG rate assessment: normal     Rhythm: sinus rhythm     Ectopy: none     Conduction: normal     The patient is on the cardiac monitor to evaluate for evidence of arrhythmia and/or significant heart rate changes.   MEDICATIONS ORDERED IN ED: Medications  lactated ringers bolus 2,000 mL (has no administration in time range)  ketorolac (TORADOL) 30 MG/ML injection 15 mg (15 mg Intravenous Given 02/06/22 1455)  morphine (PF) 4 MG/ML injection 4 mg (4 mg Intravenous Given 02/06/22  1457)  ondansetron (ZOFRAN) injection 4 mg (4 mg Intravenous Given 02/06/22 1457)     IMPRESSION / MDM / ASSESSMENT AND PLAN / ED COURSE  I reviewed the triage vital signs and the nursing notes. Patient's presentation is most consistent with acute presentation with potential threat to life or bodily function.                               Differential diagnosis includes, but is not limited to PE, apical pneumothorax, pneumonia, colitis, ACS, pleurisy, pericarditis possibly referred cholecystitis though there is no tenderness in her upper quadrant and have a lower suspicion for this at this time.  No rashes to suggest shingles at this time.  No history of recent trauma.  EKG is remarkable for sinus rhythm with a ventricular rate of 91, normal axis, unremarkable intervals without clear evidence of acute ischemia or significant arrhythmia.  Chest reviewed by myself shows no focal consoidation, effusion, edema, pneumothorax or other clear acute thoracic process. I also reviewed radiology interpretation and agree with findings described.  CBC without leukocytosis or acute anemia.  Lipase WNL not suggestive of pancreatitis.  Hepatic function panel unremarkable.  BMP is remarkable for appears to be an AKI with creatinine of 1.74 compared to 1.141-year ago without any other significant lecture light or metabolic derangements.  Care patient signed over to attending provider at approximately 1500 with plan to follow-up remaining labs and CT chest and reassess.      FINAL CLINICAL IMPRESSION(S) / ED DIAGNOSES   Final diagnoses:  Chest pain, unspecified type  Nausea and vomiting, unspecified vomiting type  AKI (acute kidney injury) ([place])     Rx / DC Orders   ED Discharge Orders     None        Note:  This document was prepared using Dragon voice recognition software and may include unintentional dictation errors.   Lucrezia Starch, MD 02/06/22 1514    Lucrezia Starch,  MD 02/06/22 [phone]

## 2022-02-06 NOTE — ED Notes (Signed)
Pt in CT.

## 2022-02-06 NOTE — Discharge Instructions (Addendum)
Please be sure to follow up with your PCP about the thyroid nodule seen on CT scan today. Please seek medical attention for any high fevers, chest pain, shortness of breath, change in behavior, persistent vomiting, bloody stool or any other new or concerning symptoms.

## 2022-02-06 NOTE — ED Notes (Signed)
ED Provider at bedside. 

## 2022-02-06 NOTE — ED Triage Notes (Signed)
Sudden onset. Chest pain x 45 mins. Midsternal tightness with sharp pain in shoulder blades. C/o blurred vision, N/V, SOB, and diaphoretic Hx of PE and DVT

## 2022-02-19 IMAGING — RF DG ESOPHAGUS
8 of 11 series · 12 of 23 positions shown · non-contrast
Comparison: None.

CLINICAL DATA: Dysphasia.

EXAM:
ESOPHOGRAM / BARIUM SWALLOW / BARIUM TABLET STUDY
TECHNIQUE: Combined double contrast and single contrast examination performed
using effervescent crystals, thick barium liquid, and thin barium
liquid. The patient was observed with fluoroscopy swallowing a 13 mm
barium sulphate tablet.
FLUOROSCOPY TIME:  Fluoroscopy Time:  1 minutes
Radiation Exposure Index (if provided by the fluoroscopic device):
7.7 mGy
Number of Acquired Spot Images: 0

[Series 1: cp_standard · 0.25mm/px · 1 of 1 slices shown (1 of 8)]
[im 1/1]
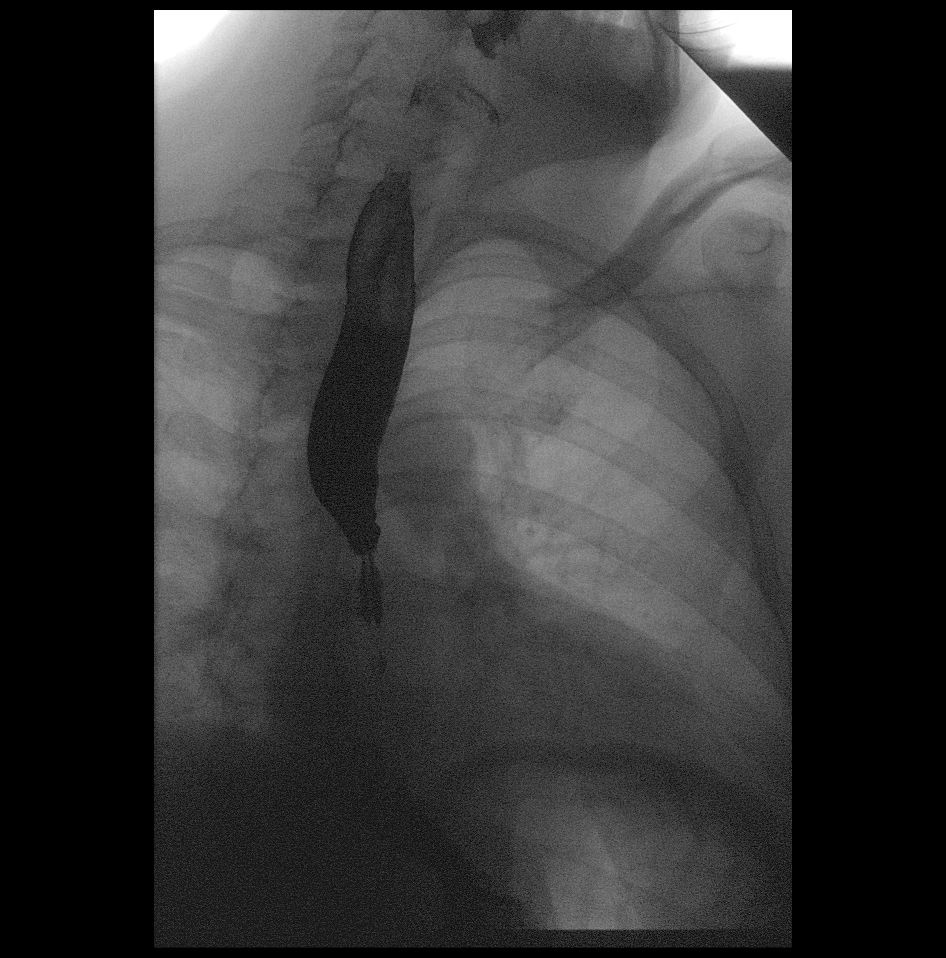

[Series 3: cp_standard · 0.25mm/px · 1 of 1 slices shown (2 of 8)]
[im 1/1]
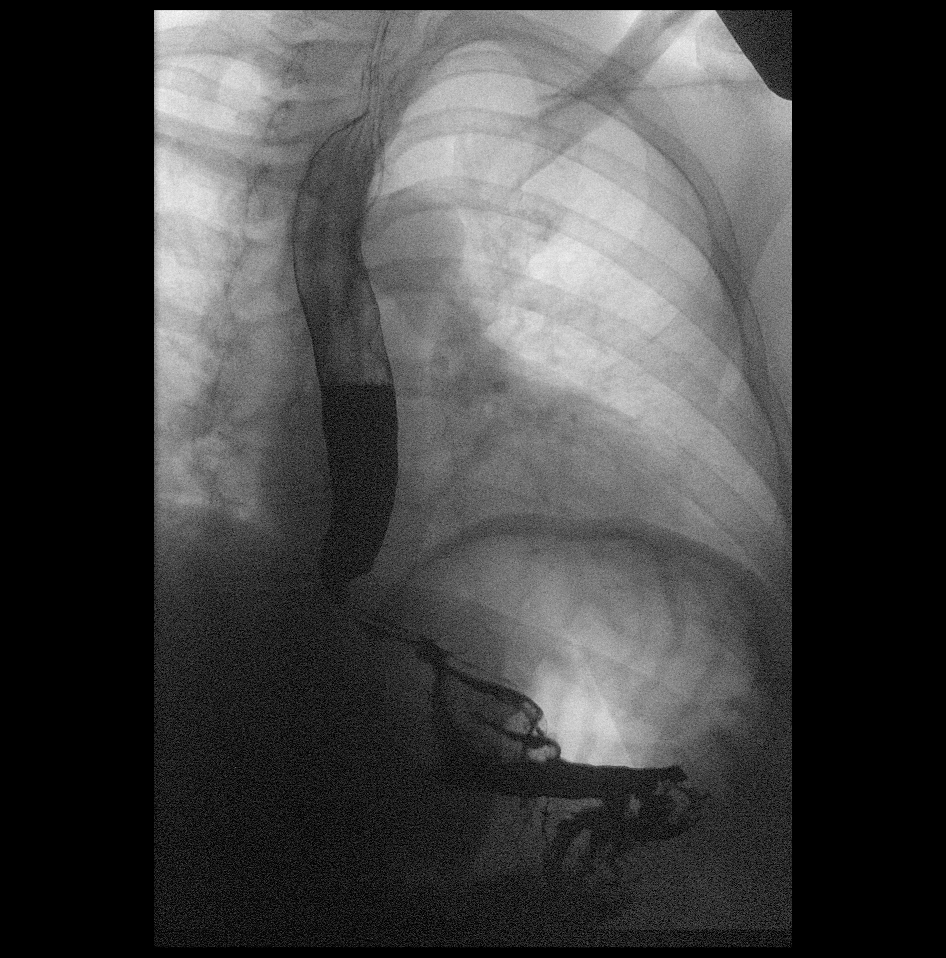

[Series 5: cp_standard · 0.25mm/px · 1 of 1 slices shown (3 of 8)]
[im 1/1]
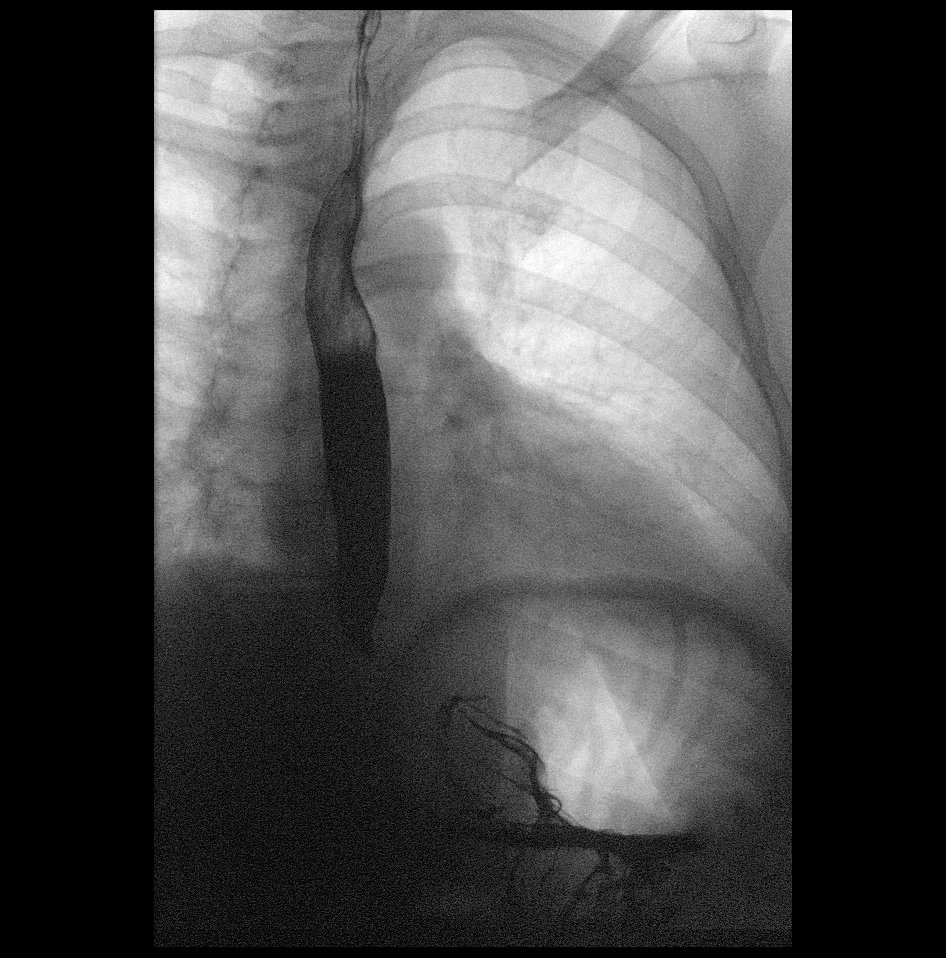

[Series 6: cp_standard · 0.51mm/px · 2 of 119 frames shown (4 of 8)]
[frame 44/119]
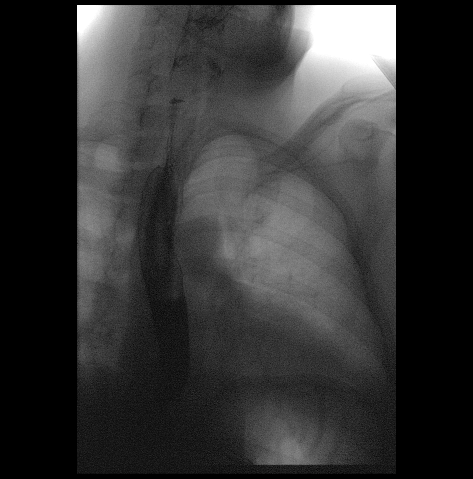
[frame 102/119]
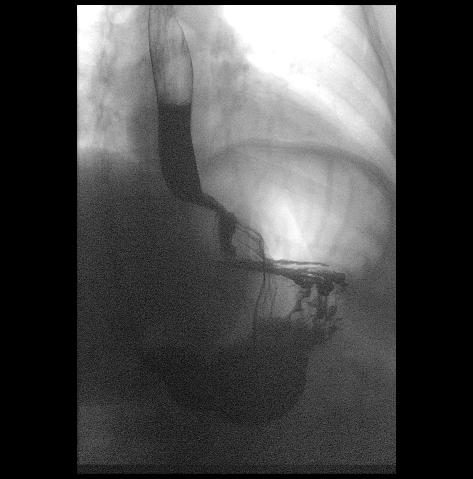

[Series 7: cp_standard · 0.51mm/px · 2 of 145 frames shown (5 of 8)]
[frame 73/145]
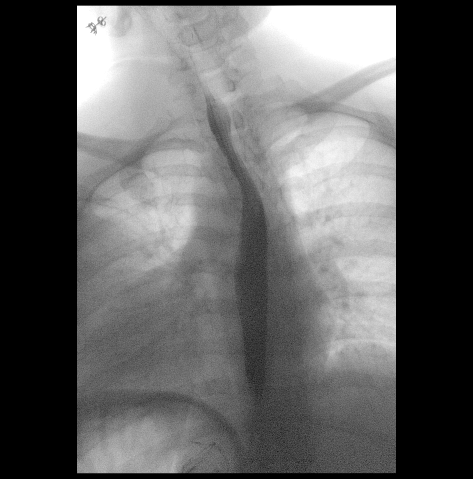
[frame 145/145]
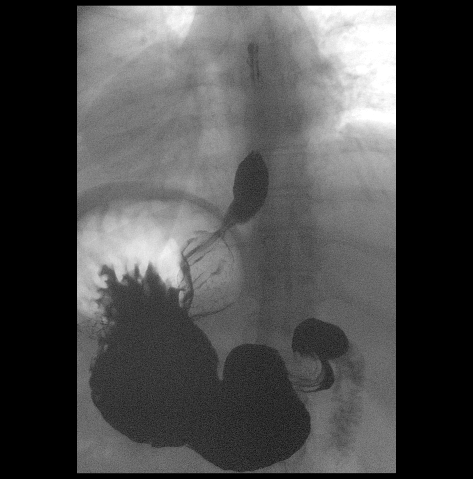

[Series 9: cp_standard · 0.25mm/px · 1 of 1 slices shown (6 of 8)]
[im 1/1]
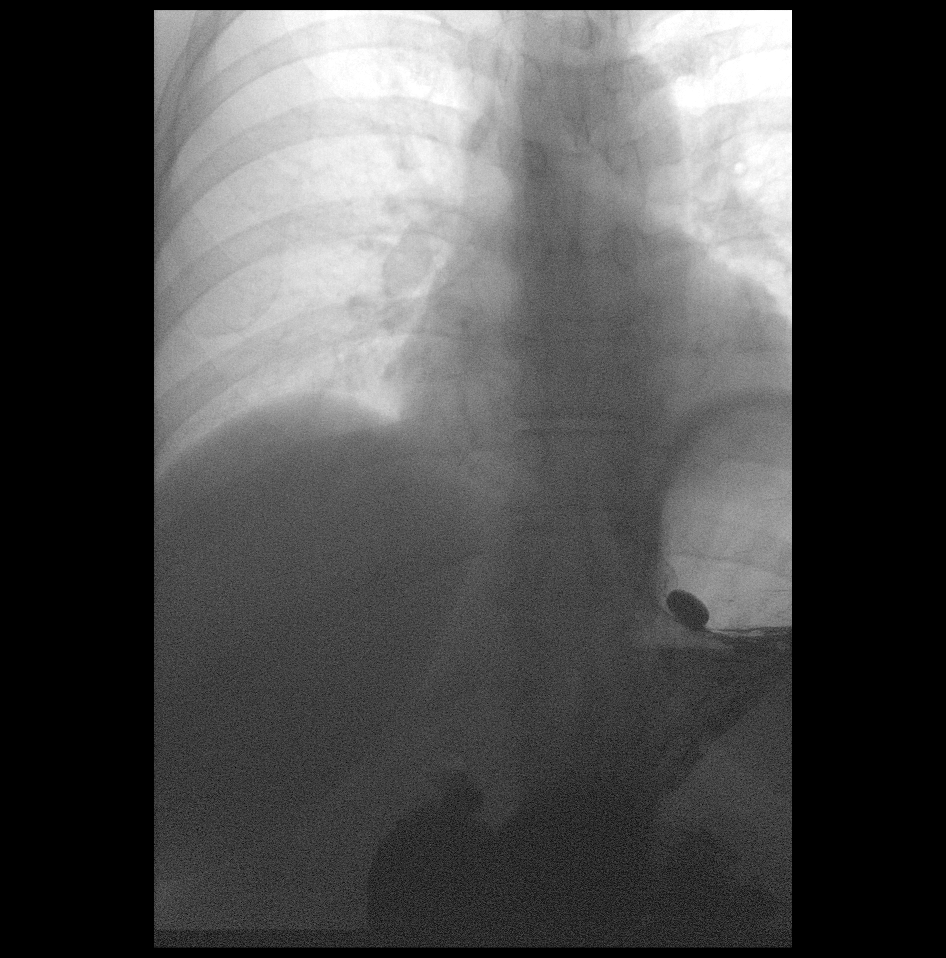

[Series 10: cp_standard · 0.51mm/px · 2 of 39 frames shown (7 of 8)]
[frame 12/39]
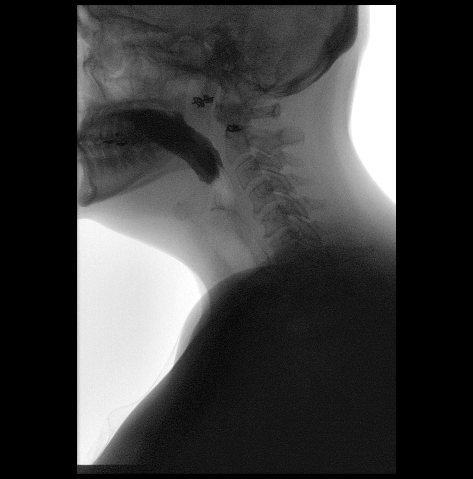
[frame 34/39]
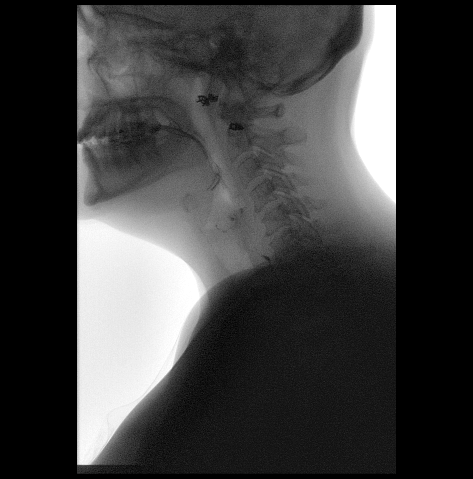

[Series 11: cp_standard · 0.51mm/px · 2 of 30 frames shown (8 of 8)]
[frame 16/30]
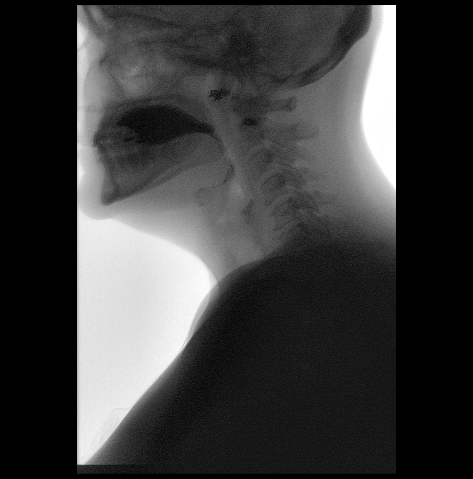
[frame 26/30]
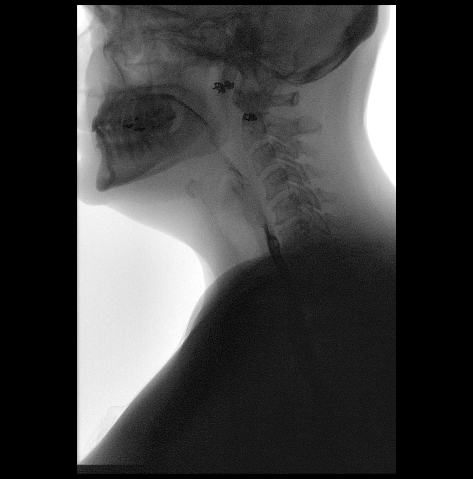

[12 of 23 positions shown; findings below may reference images not displayed]

FINDINGS: The esophagus is patent. No stricture or mass identified. 13 mm
barium tablet was ingested which easily passed through the esophagus
and into the stomach.

The motility of the esophagus is mildly abnormal with signs of
multiple non propulsive tertiary waves.

No hiatal hernia or reflux identified.

Swallowing mechanism appears normal.
IMPRESSION: 1. No stricture or mass to explain patient's dysphagia.
2. Mild esophageal dysmotility.

## 2022-12-13 ENCOUNTER — Ambulatory Visit
Admission: RE | Admit: 2022-12-13 | Discharge: 2022-12-13 | Disposition: A | Discharge status: Home / Self Care | Payer: Managed Care, Other (non HMO) | Source: Ambulatory Visit | Attending: Emergency Medicine | Admitting: Emergency Medicine

## 2022-12-13 VITALS — BP 156/106 | HR 103 | Temp 97.8°F | Resp 18

## 2022-12-13 DIAGNOSIS — J069 Acute upper respiratory infection, unspecified: Secondary | ICD-10-CM | POA: Diagnosis not present

## 2022-12-13 MED ORDER — HYDROCODONE BIT-HOMATROP MBR 5-1.5 MG/5ML PO SOLN: 5.0000 mL | Freq: Two times a day (BID) | ORAL | 0 refills | Status: DC | PRN

## 2022-12-13 MED ORDER — BENZONATATE 100 MG PO CAPS: 100.0000 mg | ORAL_CAPSULE | Freq: Three times a day (TID) | ORAL | 0 refills | Status: DC

## 2022-12-13 MED ORDER — AMOXICILLIN-POT CLAVULANATE 875-125 MG PO TABS: 1.0000 | ORAL_TABLET | Freq: Two times a day (BID) | ORAL | 0 refills | Status: AC

## 2022-12-13 NOTE — ED Triage Notes (Signed)
Patient presents to UC for chest congestion and cough x 3 weeks. States she hears rattling and possibly had intermittent fever. Taking mucinex D.

## 2022-12-13 NOTE — ED Provider Notes (Signed)
Renaldo Fiddler    CSN: [id number] Arrival date & time: 12/13/22  1049      History   Chief Complaint Chief Complaint  Patient presents with   Cough    Chest congestion with painful cough - Entered by patient    HPI Monica Floyd is a 52 y.o. female.   Patient presents for evaluation of nasal congestion, rhinorrhea, sore throat, productive cough with green sputum, shortness of breath with exertion, wheezing and chest soreness present for 21 days.  Sore throat is only experience when coughing.  Associated subjective fever and chills.  Symptoms have worsened in severity over the last week.  No known sick contact prior.  Tolerating food and liquids but decreased appetite.  Has attempted use of Mucinex which has been minimally helped will.  History of seasonal allergies,  .  Past Medical History:  Diagnosis Date   Abnormal uterine bleeding (AUB) 04/16/2020   Allergy    Anxiety    DVT (deep venous thrombosis) (HCC)    Gastritis    GERD (gastroesophageal reflux disease)    Pulmonary embolism (HCC) 2021   Seasonal allergies     Patient Active Problem List   Diagnosis Date Noted   Abnormal uterine bleeding (AUB) 04/16/2020   Elevated serum creatinine 04/16/2020   Fibroids, subserous and exophytic 04/16/2020   Colon cancer screening 11/23/2019   Chronic anticoagulation 11/23/2019   Dysphagia 11/23/2019   Gastroesophageal reflux disease 11/09/2019   Anxiety 11/09/2019   Acute deep vein thrombosis (DVT) of lower extremity (HCC) 11/09/2019   Pulmonary embolus (HCC) 11/09/2019   DVT (deep venous thrombosis) (HCC) 09/03/2019    Past Surgical History:  Procedure Laterality Date   COLONOSCOPY  04/22/2020   DILITATION & CURRETTAGE/HYSTROSCOPY WITH HYDROTHERMAL ABLATION N/A 04/16/2020   Procedure: DILATATION & CURETTAGE/HYSTEROSCOPY WITH HYDROTHERMAL ENDOMETRIAL ABLATION;  Surgeon: Tereso Newcomer, MD;  [facility]  [facility];  Service: Gynecology;  Laterality:  N/A;   KNEE ARTHROSCOPY W/ ACL RECONSTRUCTION Right 2012   UPPER GASTROINTESTINAL ENDOSCOPY  04/22/2020   WISDOM TOOTH EXTRACTION      OB History     Gravida  2   Para  2   Term  2   Preterm      AB      Living  2      SAB      IAB      Ectopic      Multiple      Live Births  2            Home Medications    Prior to Admission medications   Medication Sig Start Date End Date Taking? Authorizing Provider  amoxicillin-clavulanate (AUGMENTIN) 875-125 MG tablet Take 1 tablet by mouth every 12 (twelve) hours for 10 days. 12/13/22 12/23/22 Yes Sahirah Rudell R, NP  benzonatate (TESSALON) 100 MG capsule Take 1 capsule (100 mg total) by mouth every 8 (eight) hours. 12/13/22  Yes Akif Weldy R, NP  HYDROcodone bit-homatropine (HYCODAN) 5-1.5 MG/5ML syrup Take 5 mLs by mouth 2 (two) times daily as needed for cough. 12/13/22  Yes Cassundra Mckeever R, NP  cetirizine (ZYRTEC) 10 MG tablet Take 10 mg by mouth daily.    [provider]  hydrOXYzine (ATARAX/VISTARIL) 10 MG tablet TAKE 1 TABLET BY MOUTH EVERY DAY AS NEEDED 11/21/19   Lorre Munroe, NP  meloxicam (MOBIC) 15 MG tablet Take 1 tablet (15 mg total) by mouth daily. 08/14/21   Felecia Shelling, DPM  methylPREDNISolone (MEDROL DOSEPAK) 4 MG TBPK tablet 6 day dose pack - take as directed 08/14/21   Felecia Shelling, DPM  omeprazole (PRILOSEC OTC) 20 MG tablet Take 1 tablet (20 mg total) by mouth daily. 02/06/22 02/06/23  Phineas Semen, MD  omeprazole (PRILOSEC) 40 MG capsule Take 1 capsule (40 mg total) by mouth in the morning and at bedtime. 06/26/20   Tressia Danas, MD  sucralfate (CARAFATE) 1 g tablet Take 1 tablet (1 g total) by mouth 4 (four) times daily. 02/06/22   Phineas Semen, MD    Family History Family History  Problem Relation Age of Onset   Breast cancer Mother    Pernicious anemia Mother    Cancer Father        prostate/bladder   COPD Father    Breast cancer Maternal Grandmother 49    Alzheimer's disease Maternal Grandmother    Breast cancer Maternal Aunt 64   Colon cancer Neg Hx    Rectal cancer Neg Hx    Esophageal cancer Neg Hx    Stomach cancer Neg Hx    AAA (abdominal aortic aneurysm) Neg Hx     Social History Social History   Tobacco Use   Smoking status: Never   Smokeless tobacco: Never  Vaping Use   Vaping Use: Never used  Substance Use Topics   Alcohol use: Yes    Alcohol/week: 4.0 - 5.0 standard drinks of alcohol    Types: 4 - 5 Standard drinks or equivalent per week    Comment: occ   Drug use: No     Allergies   Patient has no known allergies.   Review of Systems Review of Systems  Respiratory:  Positive for cough.      Physical Exam Triage Vital Signs ED Triage Vitals  Enc Vitals Group     BP 12/13/22 1109 (!) 156/106     Pulse Rate 12/13/22 1109 (!) 103     Resp 12/13/22 1109 18     Temp 12/13/22 1109 97.8 F (36.6 C)     Temp Source 12/13/22 1109 Temporal     SpO2 12/13/22 1109 96 %     Weight --      Height --      Head Circumference --      Peak Flow --      Pain Score 12/13/22 1110 0     Pain Loc --      Pain Edu? --      Excl. in GC? --    No data found.  Updated Vital Signs BP (!) 156/106 (BP Location: Left Arm)   Pulse (!) 103   Temp 97.8 F (36.6 C) (Temporal)   Resp 18   SpO2 96%   Visual Acuity Right Eye Distance:   Left Eye Distance:   Bilateral Distance:    Right Eye Near:   Left Eye Near:    Bilateral Near:     Physical Exam Constitutional:      Appearance: Normal appearance.  HENT:     Right Ear: Tympanic membrane, ear canal and external ear normal.     Left Ear: Tympanic membrane, ear canal and external ear normal.     Nose: Congestion and rhinorrhea present.     Mouth/Throat:     Mouth: Mucous membranes are moist.     Pharynx: Oropharynx is clear.  Eyes:     Extraocular Movements: Extraocular movements intact.  Cardiovascular:     Rate and Rhythm: Normal rate and regular  rhythm.      Pulses: Normal pulses.     Heart sounds: Normal heart sounds.  Pulmonary:     Effort: Pulmonary effort is normal.     Breath sounds: Normal breath sounds.     Comments: Harsh congested cough witnessed Neurological:     Mental Status: She is alert and oriented to person, place, and time. Mental status is at baseline.      UC Treatments / Results  Labs (all labs ordered are listed, but only abnormal results are displayed) Labs Reviewed - No data to display  EKG   Radiology No results found.  Procedures Procedures (including critical care time)  Medications Ordered in UC Medications - No data to display  Initial Impression / Assessment and Plan / UC Course  I have reviewed the triage vital signs and the nursing notes.  Pertinent labs & imaging results that were available during my care of the patient were reviewed by me and considered in my medical decision making (see chart for details).  Acute URI  Patient is in no signs of distress nor toxic appearing.  Vital signs are stable.  Low suspicion for pneumonia, pneumothorax or bronchitis and therefore will defer imaging.  Testing deferred due to timeline of illness.  Symptoms present for 3 weeks we will prophylactically provide antibiotic, Augmentin prescribed with Tessalon and ProAir Hycodan for additional comfort, PDMP reviewed, low risk.May use additional over-the-counter medications as needed for supportive care.  May follow-up with urgent care as needed if symptoms persist or worsen.   Final Clinical Impressions(s) / UC Diagnoses   Final diagnoses:  Acute URI     Discharge Instructions      Begin Augmentin every morning and every evening for 10 days to support for bacteria that is most likely adding to your symptoms, low suspicion for pneumonia at this time as well listening to lungs are clear and you are getting enough air  You may use Tessalon pill every 8 hours to help calm your coughing  You may use cough  syrup twice daily as needed for additional comfort, be mindful this may make you feel drowsy    You can take Tylenol and/or Ibuprofen as needed for fever reduction and pain relief.   For cough: honey 1/2 to 1 teaspoon (you can dilute the honey in water or another fluid).  You can also use guaifenesin and dextromethorphan for cough. You can use a humidifier for chest congestion and cough.  If you don't have a humidifier, you can sit in the bathroom with the hot shower running.      For sore throat: try warm salt water gargles, cepacol lozenges, throat spray, warm tea or water with lemon/honey, popsicles or ice, or OTC cold relief medicine for throat discomfort.   For congestion: take a daily anti-histamine like Zyrtec, Claritin, and a oral decongestant, such as pseudoephedrine.  You can also use Flonase 1-2 sprays in each nostril daily.   It is important to stay hydrated: drink plenty of fluids (water, gatorade/powerade/pedialyte, juices, or teas) to keep your throat moisturized and help further relieve irritation/discomfort.    ED Prescriptions     Medication Sig Dispense Auth. Provider   amoxicillin-clavulanate (AUGMENTIN) 875-125 MG tablet Take 1 tablet by mouth every 12 (twelve) hours for 10 days. 20 tablet Tinnie Kunin R, NP   HYDROcodone bit-homatropine (HYCODAN) 5-1.5 MG/5ML syrup Take 5 mLs by mouth 2 (two) times daily as needed for cough. 120 mL Valinda Hoar, NP  benzonatate (TESSALON) 100 MG capsule Take 1 capsule (100 mg total) by mouth every 8 (eight) hours. 21 capsule Zymere Patlan, Elita Boone, NP      I have reviewed the PDMP during this encounter.   Valinda Hoar, NP 12/13/22 1143

## 2022-12-13 NOTE — Discharge Instructions (Signed)
Begin Augmentin every morning and every evening for 10 days to support for bacteria that is most likely adding to your symptoms, low suspicion for pneumonia at this time as well listening to lungs are clear and you are getting enough air  You may use Tessalon pill every 8 hours to help calm your coughing  You may use cough syrup twice daily as needed for additional comfort, be mindful this may make you feel drowsy    You can take Tylenol and/or Ibuprofen as needed for fever reduction and pain relief.   For cough: honey 1/2 to 1 teaspoon (you can dilute the honey in water or another fluid).  You can also use guaifenesin and dextromethorphan for cough. You can use a humidifier for chest congestion and cough.  If you don't have a humidifier, you can sit in the bathroom with the hot shower running.      For sore throat: try warm salt water gargles, cepacol lozenges, throat spray, warm tea or water with lemon/honey, popsicles or ice, or OTC cold relief medicine for throat discomfort.   For congestion: take a daily anti-histamine like Zyrtec, Claritin, and a oral decongestant, such as pseudoephedrine.  You can also use Flonase 1-2 sprays in each nostril daily.   It is important to stay hydrated: drink plenty of fluids (water, gatorade/powerade/pedialyte, juices, or teas) to keep your throat moisturized and help further relieve irritation/discomfort.

## 2023-05-27 ENCOUNTER — Encounter: Payer: Managed Care, Other (non HMO) | Admitting: Family

## 2023-06-06 ENCOUNTER — Ambulatory Visit: Payer: Managed Care, Other (non HMO) | Admitting: Obstetrics and Gynecology

## 2023-06-15 ENCOUNTER — Encounter: Payer: Self-pay | Admitting: Family Medicine

## 2023-06-15 ENCOUNTER — Ambulatory Visit (INDEPENDENT_AMBULATORY_CARE_PROVIDER_SITE_OTHER): Payer: Managed Care, Other (non HMO) | Admitting: Family Medicine

## 2023-06-15 VITALS — BP 120/86 | HR 96 | Ht 66.0 in | Wt 211.0 lb

## 2023-06-15 DIAGNOSIS — Z1231 Encounter for screening mammogram for malignant neoplasm of breast: Secondary | ICD-10-CM

## 2023-06-15 DIAGNOSIS — Z124 Encounter for screening for malignant neoplasm of cervix: Secondary | ICD-10-CM | POA: Diagnosis not present

## 2023-06-15 DIAGNOSIS — N6012 Diffuse cystic mastopathy of left breast: Secondary | ICD-10-CM

## 2023-06-15 DIAGNOSIS — Z01419 Encounter for gynecological examination (general) (routine) without abnormal findings: Secondary | ICD-10-CM | POA: Diagnosis not present

## 2023-06-15 NOTE — Assessment & Plan Note (Addendum)
Trial of evening primrose oil Mammogram pending

## 2023-06-15 NOTE — Progress Notes (Signed)
Subjective:     Monica Floyd is a 52 y.o. female and is here for a comprehensive physical exam. The patient reports no problems. She is s/p HTA and has no cycles. Having frequent night sweats as well. This has not really improved. 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 Has fibrocystic breast changes and notes a bit more pain on left.  The following portions of the patient's history were reviewed and updated as appropriate: allergies, current medications, past family history, past medical history, past social history, past surgical history, and problem list.  Review of Systems Pertinent items noted in HPI and remainder of comprehensive ROS otherwise negative.   Objective:  Chaperone present for exam   BP 120/86   Pulse 96   Ht 5\' 6"  (1.676 m)   Wt 211 lb (95.7 kg)   BMI 34.06 kg/m  General appearance: alert, cooperative, and appears stated age Head: Normocephalic, without obvious abnormality, atraumatic Neck: no adenopathy, supple, symmetrical, trachea midline, and thyroid not enlarged, symmetric, no tenderness/mass/nodules Lungs: clear to auscultation bilaterally Breasts: normal appearance, no masses or tenderness some fibrocystic changes noted on left Heart: regular rate and rhythm, S1, S2 normal, no murmur, click, rub or gallop Abdomen: soft, non-tender; bowel sounds normal; no masses,  no organomegaly Pelvic: cervix normal in appearance, external genitalia normal, no adnexal masses or tenderness, no cervical motion tenderness, uterus normal size, shape, and consistency, and vagina normal without discharge Extremities: extremities normal, atraumatic, no cyanosis or edema Pulses: 2+ and symmetric Skin: Skin color, texture, turgor normal. No rashes or lesions Lymph nodes: Cervical, supraclavicular, and axillary nodes normal. Neurologic: Grossly normal    Assessment:    Healthy female exam.      Plan:   Problem List Items Addressed This Visit       Unprioritized   Fibrocystic  changes of left breast    Trial of evening primrose oil Mammogram pending      Other Visit Diagnoses     Screening for malignant neoplasm of cervix    -  Primary   Encounter for gynecological examination without abnormal finding       Relevant Orders   IGP, Aptima HPV, rfx 16/18,45   Encounter for screening mammogram for malignant neoplasm of breast       Relevant Orders   MM 3D SCREENING MAMMOGRAM BILATERAL BREAST         See After Visit Summary for Counseling Recommendations

## 2023-06-15 NOTE — Progress Notes (Signed)
Patient presents for Annual.   [employer] EM[employer]OY[employer]   LMP: Ablation / No cycles Last pap:  2021 WNL  Mammogram:  11/26/2019  Need scheduled at [facility]. STD Screening: Declines Flu Vaccine : pt asked if ok to receive while dealing w/ cough. No fever.   CC: Annual/None  Fun Fact: Information systems manager

## 2023-06-16 ENCOUNTER — Ambulatory Visit: Payer: Managed Care, Other (non HMO) | Admitting: Family

## 2023-06-21 LAB — IGP, APTIMA HPV, RFX 16/18,45
HPV Aptima: NEGATIVE
PAP Smear Comment: 0

## 2023-06-22 ENCOUNTER — Ambulatory Visit
Admission: RE | Admit: 2023-06-22 | Discharge: 2023-06-22 | Disposition: A | Discharge status: Home / Self Care | Payer: Managed Care, Other (non HMO) | Source: Ambulatory Visit | Attending: Family Medicine | Admitting: Family Medicine

## 2023-06-22 ENCOUNTER — Encounter: Payer: Self-pay | Admitting: Radiology

## 2023-06-22 DIAGNOSIS — Z1231 Encounter for screening mammogram for malignant neoplasm of breast: Secondary | ICD-10-CM

## 2023-08-08 ENCOUNTER — Ambulatory Visit: Payer: Managed Care, Other (non HMO) | Admitting: Family

## 2023-08-08 ENCOUNTER — Encounter: Payer: Self-pay | Admitting: Family

## 2023-08-08 VITALS — BP 122/80 | HR 81 | Temp 98.2°F | Ht 66.0 in | Wt 202.6 lb

## 2023-08-08 DIAGNOSIS — N6012 Diffuse cystic mastopathy of left breast: Secondary | ICD-10-CM | POA: Diagnosis not present

## 2023-08-08 DIAGNOSIS — R1310 Dysphagia, unspecified: Secondary | ICD-10-CM | POA: Diagnosis not present

## 2023-08-08 DIAGNOSIS — Z86711 Personal history of pulmonary embolism: Secondary | ICD-10-CM

## 2023-08-08 DIAGNOSIS — E782 Mixed hyperlipidemia: Secondary | ICD-10-CM | POA: Insufficient documentation

## 2023-08-08 DIAGNOSIS — Z79899 Other long term (current) drug therapy: Secondary | ICD-10-CM | POA: Insufficient documentation

## 2023-08-08 DIAGNOSIS — Z860100 Personal history of colon polyps, unspecified: Secondary | ICD-10-CM | POA: Insufficient documentation

## 2023-08-08 DIAGNOSIS — K224 Dyskinesia of esophagus: Secondary | ICD-10-CM | POA: Insufficient documentation

## 2023-08-08 DIAGNOSIS — Z86718 Personal history of other venous thrombosis and embolism: Secondary | ICD-10-CM | POA: Diagnosis not present

## 2023-08-08 DIAGNOSIS — E559 Vitamin D deficiency, unspecified: Secondary | ICD-10-CM

## 2023-08-08 DIAGNOSIS — R7303 Prediabetes: Secondary | ICD-10-CM

## 2023-08-08 NOTE — Assessment & Plan Note (Signed)
Pt advised of the following: Work on a diabetic diet, try to incorporate exercise at least 20-30 a day for 3 days a week or more.

## 2023-08-08 NOTE — Assessment & Plan Note (Signed)
Ordered lipid panel, pending results. Work on low cholesterol diet and exercise as tolerated

## 2023-08-08 NOTE — Assessment & Plan Note (Signed)
Managed by gynecology stable

## 2023-08-08 NOTE — Progress Notes (Unsigned)
New Patient Office Visit  Subjective:  Patient ID: Monica Floyd, female    DOB: [birth year]  Age: 53 y.o. MRN: [id number]  CC:  Chief Complaint  Patient presents with   Establish Care    HPI Monica Floyd is here to establish care as a new patient.  Oriented to practice routines and expectations.  Prior provider was: Nicki Reaper, FNP  Has not been seen since about 2022.  Dr. Tinnie Gens, obgyn.   Pt is with*** acute concerns.   chronic concerns:  Dysphagia, when she swallows she feels a pain in her lower throat She has had a swallow study in the past and saw GI for a workup of which she states was unremarkable. She does have more issues with thicker consistencies. Really only occurs with eating. Note from 2021 was to order barium esophagram, and if negative to proceed with esophageal manometry and 24 impedence testing. Omeprazole was increased to 40 mg bid. EGD 04/22/20 with gastritis. Colonoscopy 04/22/20 with polyps. Barium swallow 07/02/20 motility esophagus mildly abn with signs of multiple non propulsive tertiary waves.   Last metabolic panel Lab Results  Component Value Date   GLUCOSE 120 (H) 02/06/2022   NA 137 02/06/2022   K 3.8 02/06/2022   CL 105 02/06/2022   CO2 19 (L) 02/06/2022   BUN 18 02/06/2022   CREATININE 1.74 (H) 02/06/2022   GFRNONAA 35 (L) 02/06/2022   CALCIUM 10.1 02/06/2022   PROT 7.5 02/06/2022   ALBUMIN 4.5 02/06/2022   LABGLOB 2.1 11/09/2019   AGRATIO 2.1 11/09/2019   BILITOT 1.1 02/06/2022   ALKPHOS 49 02/06/2022   AST 20 02/06/2022   ALT 24 02/06/2022   ANIONGAP 13 02/06/2022   She does have labs with work, annual wellness. She had them done at [employer]. Pt brought in her labs, this is from 02/28/23.   Elevated creatinine: workup with nephrology unremarkable in the past. 12/2022 pt with her labwork creatinine 1.17 gfr 55 she has since increased water intake to about one gallon a day.   Hyperlipidemia: LDL at 121.   Obesity,  Prediabetes: A1c 5.7 in August. Has been working on diet/exercise. Is on compound semaglutide. Has lost 35 pounds in the last six months.       ROS: Negative unless specifically indicated above in HPI.   Current Outpatient Medications:    cetirizine (ZYRTEC) 10 MG tablet, Take 10 mg by mouth daily., Disp: , Rfl:    fluticasone (FLONASE) 50 MCG/ACT nasal spray, Place into the nose., Disp: , Rfl:    omeprazole (PRILOSEC) 40 MG capsule, Take 1 capsule (40 mg total) by mouth in the morning and at bedtime., Disp: 180 capsule, Rfl: 3   Semaglutide-Weight Management 1.7 MG/0.75ML SOAJ, Inject 1.7 mg into the skin., Disp: , Rfl:  Past Medical History:  Diagnosis Date   Abnormal uterine bleeding (AUB) 04/16/2020   Allergy    Anxiety    DVT (deep venous thrombosis) (HCC)    Gastritis    GERD (gastroesophageal reflux disease)    Pulmonary embolism (HCC) 2021   Seasonal allergies    Past Surgical History:  Procedure Laterality Date   COLONOSCOPY  04/22/2020   DILITATION & CURRETTAGE/HYSTROSCOPY WITH HYDROTHERMAL ABLATION N/A 04/16/2020   Procedure: DILATATION & CURETTAGE/HYSTEROSCOPY WITH HYDROTHERMAL ENDOMETRIAL ABLATION;  Surgeon: Tereso Newcomer, MD;  Location: [facility];  Service: Gynecology;  Laterality: N/A;   KNEE ARTHROSCOPY W/ ACL RECONSTRUCTION Right 2012   UPPER GASTROINTESTINAL ENDOSCOPY  04/22/2020  WISDOM TOOTH EXTRACTION      Objective:   Today's Vitals: BP 122/80 (BP Location: Left Arm, Patient Position: Sitting, Cuff Size: Normal)   Pulse 81   Temp 98.2 F (36.8 C) (Temporal)   Ht 5\' 6"  (1.676 m)   Wt 202 lb 9.6 oz (91.9 kg)   LMP  (LMP Unknown)   SpO2 99%   BMI 32.70 kg/m   Physical Exam Constitutional:      General: She is not in acute distress.    Appearance: Normal appearance. She is obese. She is not ill-appearing.  HENT:     Head: Normocephalic.     Right Ear: Tympanic membrane normal.     Left Ear: Tympanic membrane normal.     Nose:  Nose normal.     Mouth/Throat:     Mouth: Mucous membranes are moist.  Eyes:     Extraocular Movements: Extraocular movements intact.     Pupils: Pupils are equal, round, and reactive to light.  Cardiovascular:     Rate and Rhythm: Normal rate and regular rhythm.  Pulmonary:     Effort: Pulmonary effort is normal.     Breath sounds: Normal breath sounds.  Abdominal:     General: Abdomen is flat. Bowel sounds are normal.     Palpations: Abdomen is soft.     Tenderness: There is no guarding or rebound.  Musculoskeletal:        General: Normal range of motion.     Cervical back: Normal range of motion.  Skin:    General: Skin is warm.     Capillary Refill: Capillary refill takes less than 2 seconds.  Neurological:     General: No focal deficit present.     Mental Status: She is alert.  Psychiatric:        Mood and Affect: Mood normal.        Behavior: Behavior normal.        Thought Content: Thought content normal.        Judgment: Judgment normal.     Assessment & Plan:  Long-term current use of proton pump inhibitor therapy Assessment & Plan: Really only taking prn seems to help her symptoms she states.  Try to decrease and or avoid spicy foods, fried fatty foods, and also caffeine and chocolate as these can increase heartburn symptoms.     Fibrocystic changes of left breast Assessment & Plan: Managed by gynecology stable    Dysphagia, unspecified type  History of DVT (deep vein thrombosis)  History of pulmonary embolism  Prediabetes Assessment & Plan: Pt advised of the following: Work on a diabetic diet, try to incorporate exercise at least 20-30 a day for 3 days a week or more.    Orders: -     Basic metabolic panel -     Hemoglobin A1c  Mixed hyperlipidemia Assessment & Plan: Ordered lipid panel, pending results. Work on low cholesterol diet and exercise as tolerated   Orders: -     Lipid panel  Vitamin D deficiency -     VITAMIN D 25 Hydroxy  (Vit-D Deficiency, Fractures)  History of colon polyps  Esophageal dysmotility    Follow-up: Return in about 1 year (around 08/07/2024) for f/u CPE.   Mort Sawyers, FNP

## 2023-08-08 NOTE — Patient Instructions (Signed)
  Call insurance and see if they approve zepbound and or wegovy

## 2023-08-08 NOTE — Assessment & Plan Note (Signed)
Really only taking prn seems to help her symptoms she states.  Try to decrease and or avoid spicy foods, fried fatty foods, and also caffeine and chocolate as these can increase heartburn symptoms.

## 2023-08-09 ENCOUNTER — Encounter: Payer: Self-pay | Admitting: Family

## 2023-08-09 LAB — LIPID PANEL
Chol/HDL Ratio: 3.4 {ratio} (ref 0.0–4.4)
Cholesterol, Total: 211 mg/dL — ABNORMAL HIGH (ref 100–199)
HDL: 62 mg/dL (ref >39)
LDL Chol Calc (NIH): 129 mg/dL — ABNORMAL HIGH (ref 0–99)
Triglycerides: 112 mg/dL (ref 0–149)
VLDL Cholesterol Cal: 20 mg/dL (ref 5–40)

## 2023-08-09 LAB — HEMOGLOBIN A1C
Est. average glucose Bld gHb Est-mCnc: 108 mg/dL
Hgb A1c MFr Bld: 5.4 % (ref 4.8–5.6)

## 2023-08-09 LAB — BASIC METABOLIC PANEL
BUN/Creatinine Ratio: 13 (ref 9–23)
BUN: 14 mg/dL (ref 6–24)
CO2: 22 mmol/L (ref 20–29)
Calcium: 10.2 mg/dL (ref 8.7–10.2)
Chloride: 103 mmol/L (ref 96–106)
Creatinine, Ser: 1.09 mg/dL — ABNORMAL HIGH (ref 0.57–1.00)
Glucose: 78 mg/dL (ref 70–99)
Potassium: 4.4 mmol/L (ref 3.5–5.2)
Sodium: 140 mmol/L (ref 134–144)
eGFR: 60 mL/min/{1.73_m2} (ref >59)

## 2023-08-09 LAB — VITAMIN D 25 HYDROXY (VIT D DEFICIENCY, FRACTURES): Vit D, 25-Hydroxy: 36.5 ng/mL (ref 30.0–100.0)

## 2023-08-09 NOTE — Assessment & Plan Note (Signed)
Reviewed swallow study 2022. Pt to f/u with GI if ongoing concerns.

## 2023-08-09 NOTE — Assessment & Plan Note (Signed)
Provoked. Not on anticoag

## 2023-10-04 ENCOUNTER — Ambulatory Visit: Payer: Managed Care, Other (non HMO) | Admitting: Family

## 2023-10-25 ENCOUNTER — Encounter: Payer: Self-pay | Admitting: Family

## 2023-10-25 DIAGNOSIS — E782 Mixed hyperlipidemia: Secondary | ICD-10-CM

## 2023-10-25 DIAGNOSIS — R7303 Prediabetes: Secondary | ICD-10-CM

## 2023-10-25 MED ORDER — ZEPBOUND 12.5 MG/0.5ML ~~LOC~~ SOAJ: 12.5000 mg | SUBCUTANEOUS | 0 refills | Status: DC

## 2023-10-25 NOTE — Addendum Note (Signed)
 Addended by: Felicita Horns on: 10/25/2023 02:49 PM   Modules accepted: Orders

## 2023-10-26 ENCOUNTER — Other Ambulatory Visit (HOSPITAL_COMMUNITY): Payer: Self-pay

## 2023-10-27 ENCOUNTER — Telehealth: Payer: Self-pay | Admitting: Pharmacy Technician

## 2023-10-27 ENCOUNTER — Other Ambulatory Visit (HOSPITAL_COMMUNITY): Payer: Self-pay

## 2023-10-27 NOTE — Telephone Encounter (Signed)
 PA request has been Received. New Encounter has been or will be created for follow up. For additional info see Pharmacy Prior Auth telephone encounter from 10/27/23. No PA needed at this time, paid claim upon running a test claim

## 2023-10-27 NOTE — Telephone Encounter (Signed)
 Pharmacy Patient Advocate Encounter   Received notification from Patient Advice Request messages that prior authorization for ZEPBOUND 12.5 MG is required/requested.   Insurance verification completed.   The patient is insured through [facility] .   Per test claim: The current 28 day co-pay is, $24.99.  No PA needed at this time. This test claim was processed through [facility]- copay amounts may vary at other pharmacies due to pharmacy/plan contracts, or as the patient moves through the different stages of their insurance plan.

## 2023-10-31 MED ORDER — ZEPBOUND 12.5 MG/0.5ML ~~LOC~~ SOAJ: 12.5000 mg | SUBCUTANEOUS | 0 refills | Status: DC

## 2023-10-31 NOTE — Addendum Note (Signed)
 Addended by: Felicita Horns on: 10/31/2023 03:15 PM   Modules accepted: Orders

## 2023-12-01 ENCOUNTER — Other Ambulatory Visit: Payer: Self-pay | Admitting: *Deleted

## 2023-12-01 DIAGNOSIS — R7303 Prediabetes: Secondary | ICD-10-CM

## 2023-12-01 DIAGNOSIS — E782 Mixed hyperlipidemia: Secondary | ICD-10-CM

## 2023-12-01 MED ORDER — ZEPBOUND 12.5 MG/0.5ML ~~LOC~~ SOAJ: 12.5000 mg | SUBCUTANEOUS | 2 refills | Status: DC

## 2023-12-08 ENCOUNTER — Other Ambulatory Visit: Payer: Self-pay | Admitting: *Deleted

## 2023-12-08 DIAGNOSIS — E66812 Obesity, class 2: Secondary | ICD-10-CM

## 2023-12-08 DIAGNOSIS — E782 Mixed hyperlipidemia: Secondary | ICD-10-CM

## 2023-12-08 DIAGNOSIS — R7303 Prediabetes: Secondary | ICD-10-CM

## 2023-12-08 MED ORDER — ZEPBOUND 12.5 MG/0.5ML ~~LOC~~ SOAJ: 12.5000 mg | SUBCUTANEOUS | 2 refills | Status: DC

## 2023-12-26 ENCOUNTER — Encounter: Payer: Self-pay | Admitting: Family

## 2023-12-26 DIAGNOSIS — E782 Mixed hyperlipidemia: Secondary | ICD-10-CM

## 2023-12-26 DIAGNOSIS — E66811 Obesity, class 1: Secondary | ICD-10-CM

## 2023-12-26 DIAGNOSIS — R7303 Prediabetes: Secondary | ICD-10-CM

## 2023-12-27 DIAGNOSIS — E66811 Obesity, class 1: Secondary | ICD-10-CM | POA: Insufficient documentation

## 2023-12-27 MED ORDER — ZEPBOUND 15 MG/0.5ML ~~LOC~~ SOAJ: 15.0000 mg | SUBCUTANEOUS | 1 refills | Status: DC

## 2024-02-28 ENCOUNTER — Telehealth: Payer: Self-pay

## 2024-02-28 ENCOUNTER — Other Ambulatory Visit (HOSPITAL_COMMUNITY): Payer: Self-pay

## 2024-02-28 ENCOUNTER — Encounter: Payer: Self-pay | Admitting: Family

## 2024-02-28 DIAGNOSIS — R7303 Prediabetes: Secondary | ICD-10-CM

## 2024-02-28 DIAGNOSIS — E782 Mixed hyperlipidemia: Secondary | ICD-10-CM

## 2024-02-28 DIAGNOSIS — E66811 Obesity, class 1: Secondary | ICD-10-CM

## 2024-02-28 NOTE — Telephone Encounter (Signed)
 Pharmacy Patient Advocate Encounter   Received notification from CoverMyMeds that prior authorization for Zepbound  15MG /0.5ML pen-injectors  is required/requested.   Insurance verification completed.   The patient is insured through [facility] .   Per test claim: PA required; PA submitted to above mentioned insurance via Latent Key/confirmation #/EOC [id number] Status is pending

## 2024-02-28 NOTE — Telephone Encounter (Signed)
 Pharmacy Patient Advocate Encounter  Insurance verification completed.   The patient is insured through [employer]   Ran test claim for Zepbound  15MG /0.5ML pen-injectors PLEASE BE ADVISED TEST BILLING RECEIVED  Must enroll in Weight Watchers thru [facility] for Obesity drug coverage- [email or web address]. Allow 24 hrs. PER TEST CLAIM FROM PLAN!    I WILL PROCEED WITH PA   This test claim was processed through [facility]- copay amounts may vary at other pharmacies due to pharmacy/plan contracts, or as the patient moves through the different stages of their insurance plan.

## 2024-02-29 ENCOUNTER — Other Ambulatory Visit (HOSPITAL_COMMUNITY): Payer: Self-pay

## 2024-03-01 NOTE — Telephone Encounter (Signed)
 Pharmacy Patient Advocate Encounter  Received notification from [employer] that Prior Authorization for Zepbound  15MG /0.5ML pen-injectors  has been DENIED.  See denial reason below. No denial letter attached in CMM. Will attach denial letter to Media tab once received. SEE OUTCOME FOR PLAN IT LOOKS LIKE THE PT LAST WEIGHT LISTED IN CHART IS FROM JANUARY PLAN REQUIRES A WEIGHT CHECK TO SEE WITH USING  ZEPBOUND  PT HAS WE CAN REVISIT AFTER WEIGHT CHECK HAS BEEN DOCUMENTED IN CHART.   PA #/Case ID/Reference #: [id number]

## 2024-03-01 NOTE — Telephone Encounter (Signed)
 Called pt and set up appt.

## 2024-03-01 NOTE — Telephone Encounter (Signed)
 Please advise pt that insurance requires pt to be seen for weight documentation and f/u for zepbound  before it can be refilled. Have her schedule

## 2024-03-06 ENCOUNTER — Other Ambulatory Visit (HOSPITAL_COMMUNITY): Payer: Self-pay

## 2024-03-06 MED ORDER — ZEPBOUND 15 MG/0.5ML ~~LOC~~ SOAJ: 15.0000 mg | SUBCUTANEOUS | 1 refills | Status: DC

## 2024-03-06 NOTE — Addendum Note (Signed)
 Addended by: CORWIN ANTU on: 03/06/2024 06:45 AM   Modules accepted: Orders

## 2024-03-06 NOTE — Telephone Encounter (Signed)
 Can we please get pt in for weight in? Let her know insurance is requiring this as well for her medication zepbound 

## 2024-03-06 NOTE — Telephone Encounter (Signed)
 Can we send a prior auth for the zepbound  Pt had to complete a weight watchers subscription prior to re submitting for approval, this has been completed.

## 2024-03-07 ENCOUNTER — Telehealth: Payer: Self-pay

## 2024-03-07 ENCOUNTER — Other Ambulatory Visit (HOSPITAL_COMMUNITY): Payer: Self-pay

## 2024-03-07 NOTE — Telephone Encounter (Signed)
 Pharmacy Patient Advocate Encounter   Received notification from Pt Calls Messages that prior authorization for Zepbound  15 is required/requested.   Insurance verification completed.   The patient is insured through [facility] .   Per test claim: PA required; PA submitted to above mentioned insurance via Latent Key/confirmation #/EOC [id number] Status is pending

## 2024-03-07 NOTE — Telephone Encounter (Signed)
 Lvm to schedule

## 2024-03-08 ENCOUNTER — Other Ambulatory Visit (HOSPITAL_COMMUNITY): Payer: Self-pay

## 2024-03-08 NOTE — Telephone Encounter (Signed)
 Pharmacy Patient Advocate Encounter  Received notification from [employer] that Prior Authorization for Zepbound  15 has been CANCELLED due to    PA #/Case ID/Reference #: [id number] Patient copay for 28 day supply is $24.99

## 2024-04-03 ENCOUNTER — Encounter: Payer: Self-pay | Admitting: Family

## 2024-04-03 ENCOUNTER — Ambulatory Visit: Payer: Self-pay | Admitting: Family

## 2024-04-03 VITALS — BP 110/80 | HR 86 | Temp 98.1°F | Ht 66.5 in | Wt 180.2 lb

## 2024-04-03 DIAGNOSIS — G479 Sleep disorder, unspecified: Secondary | ICD-10-CM | POA: Diagnosis not present

## 2024-04-03 DIAGNOSIS — E66811 Obesity, class 1: Secondary | ICD-10-CM | POA: Diagnosis not present

## 2024-04-03 DIAGNOSIS — R7303 Prediabetes: Secondary | ICD-10-CM | POA: Diagnosis not present

## 2024-04-03 DIAGNOSIS — E782 Mixed hyperlipidemia: Secondary | ICD-10-CM

## 2024-04-03 MED ORDER — ZEPBOUND 15 MG/0.5ML ~~LOC~~ SOAJ: 15.0000 mg | SUBCUTANEOUS | 5 refills | Status: AC

## 2024-04-03 MED ORDER — ZEPBOUND 15 MG/0.5ML ~~LOC~~ SOAJ: 15.0000 mg | SUBCUTANEOUS | 1 refills | Status: DC

## 2024-04-03 MED ORDER — TRAZODONE HCL 50 MG PO TABS: ORAL_TABLET | ORAL | 0 refills | Status: DC

## 2024-04-03 NOTE — Progress Notes (Signed)
 Established Patient Office Visit  Subjective:      CC:  Chief Complaint  Patient presents with   Medical Management of Chronic Issues    HPI: Monica Floyd is a 53 y.o. female presenting on 04/03/2024 for Medical Management of Chronic Issues .  Discussed the use of AI scribe software for clinical note transcription with the patient, who gave verbal consent to proceed.  History of Present Illness Monica Floyd is a 53 year old female who presents for a follow-up regarding weight management and medication review.  She has successfully lost weight, decreasing from 202 pounds in January to 180 pounds currently, attributed to the use of Zepbound  at a 15 mg dose. No issues with the medication.  Her exercise routine includes Pilates twice a week and running once a week. She experiences persistent ankle swelling and instability, which affects her ability to run. She is considering switching to lower impact activities.  She is experiencing significant life stressors, including buying and selling property, her father's hospitalization and rehabilitation, and caring for her granddaughters. These events have disrupted her sleep, resulting in an average of 4 to 5 hours of sleep per night. She occasionally uses melatonin and takes magnesium  nightly to aid sleep.  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 Her work requires significant focus and concentration, and she has been able to manage her duties with the support of her team.  A recent lab test in early August showed her LDL cholesterol was slightly elevated at 129 mg/dL, despite her weight loss.  She is planning to move to [place] , which has added to her stress, but she intends to maintain a residence in her current location and continue her care here.  Wt Readings from Last 3 Encounters:  04/03/24 180 lb 3.2 oz (81.7 kg)  08/08/23 202 lb 9.6 oz (91.9 kg)   06/15/23 211 lb (95.7 kg)       04/03/2024    9:50 AM 08/08/2023   12:08 PM 06/15/2023    4:00 PM  PHQ9 SCORE ONLY  PHQ-9 Total Score 1 1  1       Data saved with a previous flowsheet row definition      04/03/2024    9:50 AM 08/08/2023   12:08 PM 06/15/2023    4:00 PM  GAD 7 : Generalized Anxiety Score  Nervous, Anxious, on Edge 1 0 0  Control/stop worrying 1 0 0  Worry too much - different things 1 0 0  Trouble relaxing 1 0 0  Restless 0 0 0  Easily annoyed or irritable 0 0 0  Afraid - awful might happen 0 0 0  Total GAD 7 Score 4 0 0  Anxiety Difficulty Not difficult at all Not difficult at all Not difficult at all          Social history:  Relevant past medical, surgical, family and social history reviewed and updated as indicated. Interim medical history since our last visit reviewed.  Allergies and medications reviewed and updated.  DATA REVIEWED: CHART IN EPIC     ROS: Negative unless specifically indicated above in HPI.    Current Outpatient Medications:    cetirizine (ZYRTEC) 10 MG tablet, Take 10 mg by mouth daily., Disp: , Rfl:    fluticasone (FLONASE) 50 MCG/ACT nasal spray, Place into the nose., Disp: , Rfl:    omeprazole  (PRILOSEC ) 40 MG capsule, Take 1 capsule (40 mg  total) by mouth in the morning and at bedtime., Disp: 180 capsule, Rfl: 3   traZODone  (DESYREL ) 50 MG tablet, Take 1-2 tablets po at bedtime prn insomnia, Disp: 60 tablet, Rfl: 0   tirzepatide  (ZEPBOUND ) 15 MG/0.5ML Pen, Inject 15 mg into the skin once a week., Disp: 2 mL, Rfl: 5        Objective:        BP 110/80 (BP Location: Left Arm, Patient Position: Sitting, Cuff Size: Normal)   Pulse 86   Temp 98.1 F (36.7 C) (Temporal)   Ht 5' 6.5 (1.689 m)   Wt 180 lb 3.2 oz (81.7 kg)   LMP  (LMP Unknown)   SpO2 99%   BMI 28.65 kg/m   Physical Exam MEASUREMENTS: Weight- 180, BMI- 26.0.  Wt Readings from Last 3 Encounters:  04/03/24 180 lb 3.2 oz (81.7 kg)  08/08/23 202  lb 9.6 oz (91.9 kg)  06/15/23 211 lb (95.7 kg)    Physical Exam Vitals reviewed.  Constitutional:      Appearance: Normal appearance.  Cardiovascular:     Rate and Rhythm: Normal rate and regular rhythm.  Pulmonary:     Effort: Pulmonary effort is normal.  Neurological:     Mental Status: She is alert.          Results LABS LDL: 129 (02/2022)  Assessment & Plan:   Assessment and Plan Assessment & Plan Overweight with prediabetes and mixed hyperlipidemia Significant weight loss achieved, currently in the overweight category. LDL cholesterol remains slightly elevated at 129 mg/dL, above the target of less than 100 mg/dL. Continued management with semaglutide (Zepbound ) is effective for weight management. Insurance issues with medication coverage have been resolved by adjusting prescription to a 30-day supply and ensuring enrollment in [employer] through [employer]'s employee portal. - Continue semaglutide (Zepbound ) 15 mg subcutaneously as prescribed. - Enroll in [employer] through [employer]'s employee portal to maintain medication coverage.  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 Running exacerbates symptoms, and there is a risk of further joint damage and arthritis progression. Advised to switch to lower-impact exercises to prevent further injury. - Avoid high-impact activities such as running. - Engage in lower-impact exercises such as Pilates, rowing, biking, or swimming. - Consider using NSAIDs and Tylenol  in combination for pain management as needed.  Insomnia Insomnia likely related to current life stressors, including family responsibilities and relocation. Currently sleeping 4-5 hours per night. Magnesium  is taken nightly, and melatonin is used occasionally. Discussed the option of using trazodone  as needed for better sleep quality. -  Continue magnesium  supplementation nightly. - Prescribe trazodone  50 mg as needed for sleep, with the option to adjust dosage based on response.  Recording duration: 19 minutes      Return in about 6 months (around 10/01/2024) for f/u CPE.     Ginger Patrick, MSN, APRN, FNP-C [facility]

## 2024-04-06 ENCOUNTER — Other Ambulatory Visit (HOSPITAL_COMMUNITY): Payer: Self-pay

## 2024-04-06 ENCOUNTER — Telehealth: Payer: Self-pay

## 2024-04-06 NOTE — Telephone Encounter (Signed)
 Pharmacy Patient Advocate Encounter   Received notification from Onbase that prior authorization for Zepbound  15 is required/requested.   Insurance verification completed.   The patient is insured through [facility] .   Per test claim: The current 28 day co-pay is, $24.99.  No PA needed at this time. This test claim was processed through [facility]- copay amounts may vary at other pharmacies due to pharmacy/plan contracts, or as the patient moves through the different stages of their insurance plan.

## 2024-08-09 ENCOUNTER — Other Ambulatory Visit: Payer: Self-pay | Admitting: *Deleted

## 2024-08-09 DIAGNOSIS — G479 Sleep disorder, unspecified: Secondary | ICD-10-CM

## 2024-08-09 MED ORDER — TRAZODONE HCL 50 MG PO TABS: ORAL_TABLET | ORAL | 1 refills | Status: AC

## 2024-10-19 ENCOUNTER — Ambulatory Visit: Payer: Self-pay | Admitting: Family
